# Patient Record
Sex: Male | Born: 1980 | Race: White | Hispanic: No | Marital: Married | Smoking: Never smoker
Health system: Southern US, Community
[De-identification: ages and names within clinical notes are randomized; demographics above are authoritative.]

## PROBLEM LIST (undated history)

## (undated) DIAGNOSIS — Z8619 Personal history of other infectious and parasitic diseases: Secondary | ICD-10-CM

## (undated) DIAGNOSIS — N200 Calculus of kidney: Secondary | ICD-10-CM

## (undated) HISTORY — DX: Personal history of other infectious and parasitic diseases: Z86.19

## (undated) HISTORY — PX: NO PAST SURGERIES: SHX2092

## (undated) HISTORY — DX: Calculus of kidney: N20.0

---

## 2015-06-15 ENCOUNTER — Emergency Department (HOSPITAL_COMMUNITY)
Admission: EM | Admit: 2015-06-15 | Discharge: 2015-06-15 | Disposition: A | Discharge status: Home / Self Care | Payer: Federal, State, Local not specified - PPO | Source: Home / Self Care | Attending: Family Medicine | Admitting: Family Medicine

## 2015-06-15 ENCOUNTER — Encounter (HOSPITAL_COMMUNITY): Payer: Self-pay | Admitting: Emergency Medicine

## 2015-06-15 DIAGNOSIS — R319 Hematuria, unspecified: Secondary | ICD-10-CM | POA: Diagnosis not present

## 2015-06-15 LAB — POCT URINALYSIS DIP (DEVICE)
BILIRUBIN URINE: NEGATIVE
Glucose, UA: NEGATIVE mg/dL
Ketones, ur: NEGATIVE mg/dL
LEUKOCYTES UA: NEGATIVE
NITRITE: NEGATIVE
Protein, ur: NEGATIVE mg/dL
Specific Gravity, Urine: 1.02 (ref 1.005–1.030)
Urobilinogen, UA: 0.2 mg/dL (ref 0.0–1.0)
pH: 6.5 (ref 5.0–8.0)

## 2015-06-15 NOTE — Discharge Instructions (Signed)

## 2015-06-15 NOTE — ED Provider Notes (Signed)
CSN: [id number]     Arrival date & time 06/15/15  1809 History   First MD Initiated Contact with Patient 06/15/15 1855     Chief Complaint  Patient presents with  . Flank Pain   (Consider location/radiation/quality/duration/timing/severity/associated sxs/prior Treatment) Patient is a 34 y.o. male presenting with flank pain. The history is provided by the patient. No language interpreter was used.  Flank Pain This is a new problem. The current episode started yesterday. The problem occurs constantly. The problem has been gradually worsening. Pertinent negatives include no abdominal pain. Nothing aggravates the symptoms. Nothing relieves the symptoms. He has tried nothing for the symptoms.  Pt had pain in his back earlier today and then noticed blood in his urine. Pt had a kidney stone 2 years ago.  Pain is gone now.   Past Medical History  Diagnosis Date  . Hypertension    History reviewed. No pertinent past surgical history. No family history on file. Social History  Substance Use Topics  . Smoking status: Never Smoker   . Smokeless tobacco: Current User    Types: Chew  . Alcohol Use: Yes     Comment: socially     Review of Systems  Gastrointestinal: Negative for abdominal pain.  Genitourinary: Positive for flank pain.  All other systems reviewed and are negative.   Allergies  Review of patient's allergies indicates no known allergies.  Home Medications   Prior to Admission medications   Not on File   Meds Ordered and Administered this Visit  Medications - No data to display  BP 128/81 mmHg  Pulse 80  Temp(Src) 98.4 F (36.9 C) (Oral)  Resp 16  SpO2 97% No data found.   Physical Exam  Constitutional: He appears well-developed and well-nourished.  HENT:  Head: Normocephalic.  Neck: Normal range of motion.  Cardiovascular: Normal rate.   Pulmonary/Chest: Effort normal.  Abdominal: Soft.  Musculoskeletal: Normal range of motion.  Neurological: He is alert.   Skin: Skin is warm.  Psychiatric: He has a normal mood and affect.  Nursing note and vitals reviewed.   ED Course  Procedures (including critical care time)  Labs Review Labs Reviewed  POCT URINALYSIS DIP (DEVICE) - Abnormal; Notable for the following:    Hgb urine dipstick LARGE (*)    All other components within normal limits    Imaging Review No results found.   Visual Acuity Review  Right Eye Distance:   Left Eye Distance:   Bilateral Distance:    Right Eye Near:   Left Eye Near:    Bilateral Near:         MDM Pt counseled on hematuria.  He may have passed a stone.   Pt advised if pain returns go to the Emergency department.    1. Hematuria    Follow up with urology for recheck.     [place], PA-C 06/15/15 1934

## 2015-06-15 NOTE — ED Notes (Signed)
Pt here with localized left flank intermit pain that eases with rest  sx's dull ache started yesterday with hematuria Hx Kidney stone 2 years ago Denies fever,chills,n,v afebrile

## 2015-06-17 ENCOUNTER — Emergency Department (HOSPITAL_COMMUNITY): Payer: Federal, State, Local not specified - PPO

## 2015-06-17 ENCOUNTER — Emergency Department (HOSPITAL_COMMUNITY)
Admission: EM | Admit: 2015-06-17 | Discharge: 2015-06-17 | Disposition: A | Discharge status: Home / Self Care | Payer: Federal, State, Local not specified - PPO | Attending: Emergency Medicine | Admitting: Emergency Medicine

## 2015-06-17 ENCOUNTER — Encounter (HOSPITAL_COMMUNITY): Payer: Self-pay | Admitting: Emergency Medicine

## 2015-06-17 DIAGNOSIS — R319 Hematuria, unspecified: Secondary | ICD-10-CM | POA: Diagnosis present

## 2015-06-17 DIAGNOSIS — N219 Calculus of lower urinary tract, unspecified: Secondary | ICD-10-CM | POA: Diagnosis not present

## 2015-06-17 DIAGNOSIS — I10 Essential (primary) hypertension: Secondary | ICD-10-CM | POA: Insufficient documentation

## 2015-06-17 DIAGNOSIS — R109 Unspecified abdominal pain: Secondary | ICD-10-CM

## 2015-06-17 DIAGNOSIS — R3129 Other microscopic hematuria: Secondary | ICD-10-CM

## 2015-06-17 LAB — BASIC METABOLIC PANEL
Anion gap: 7 (ref 5–15)
BUN: 13 mg/dL (ref 6–20)
CHLORIDE: 105 mmol/L (ref 101–111)
CO2: 27 mmol/L (ref 22–32)
CREATININE: 1.07 mg/dL (ref 0.61–1.24)
Calcium: 9.6 mg/dL (ref 8.9–10.3)
GFR calc Af Amer: >60 mL/min (ref >60)
GFR calc non Af Amer: >60 mL/min (ref >60)
Glucose, Bld: 83 mg/dL (ref 65–99)
Potassium: 4 mmol/L (ref 3.5–5.1)
SODIUM: 139 mmol/L (ref 135–145)

## 2015-06-17 LAB — LIPASE, BLOOD: Lipase: 32 U/L (ref 11–51)

## 2015-06-17 LAB — CBC
HCT: 44.5 % (ref 39.0–52.0)
Hemoglobin: 15.5 g/dL (ref 13.0–17.0)
MCH: 30.3 pg (ref 26.0–34.0)
MCHC: 34.8 g/dL (ref 30.0–36.0)
MCV: 87.1 fL (ref 78.0–100.0)
PLATELETS: 252 10*3/uL (ref 150–400)
RBC: 5.11 MIL/uL (ref 4.22–5.81)
RDW: 12.6 % (ref 11.5–15.5)
WBC: 8.8 10*3/uL (ref 4.0–10.5)

## 2015-06-17 LAB — URINALYSIS, ROUTINE W REFLEX MICROSCOPIC
BILIRUBIN URINE: NEGATIVE
GLUCOSE, UA: NEGATIVE mg/dL
Ketones, ur: NEGATIVE mg/dL
Leukocytes, UA: NEGATIVE
NITRITE: NEGATIVE
PH: 6.5 (ref 5.0–8.0)
Protein, ur: NEGATIVE mg/dL
Specific Gravity, Urine: 1.014 (ref 1.005–1.030)

## 2015-06-17 LAB — URINE MICROSCOPIC-ADD ON
BACTERIA UA: NONE SEEN
Squamous Epithelial / LPF: NONE SEEN

## 2015-06-17 NOTE — ED Notes (Signed)
Pt. Stated, Im having blood in my urine since Wed. Went to UC and told if pain comes back to come here.

## 2015-06-17 NOTE — ED Notes (Signed)
Ed res  At the bedside  Seeing pt at present

## 2015-06-17 NOTE — ED Provider Notes (Signed)
CSN: [id number]     Arrival date & time 06/17/15  1324 History   First MD Initiated Contact with Patient 06/17/15 1538     Chief Complaint  Patient presents with  . Hematuria  . Flank Pain    Patient is a 34 y.o. male presenting with hematuria and flank pain. The history is provided by the patient and medical records.  Hematuria This is a new problem. The current episode started in the past 7 days. The problem occurs constantly. The problem has been unchanged. Pertinent negatives include no abdominal pain, chest pain, chills, coughing, fever, headaches, nausea, neck pain, rash, sore throat or vomiting. Nothing aggravates the symptoms. He has tried NSAIDs for the symptoms.  Flank Pain Pertinent negatives include no abdominal pain, chest pain, chills, coughing, fever, headaches, nausea, neck pain, rash, sore throat or vomiting.   Past Medical History  Diagnosis Date  . Hypertension    History reviewed. No pertinent past surgical history. No family history on file. Social History  Substance Use Topics  . Smoking status: Never Smoker   . Smokeless tobacco: Current User    Types: Chew  . Alcohol Use: Yes     Comment: socially     Review of Systems  Constitutional: Negative for fever and chills.  HENT: Negative for rhinorrhea and sore throat.   Eyes: Negative for visual disturbance.  Respiratory: Negative for cough and shortness of breath.   Cardiovascular: Negative for chest pain.  Gastrointestinal: Negative for nausea, vomiting, abdominal pain, diarrhea and constipation.  Genitourinary: Positive for hematuria and flank pain. Negative for dysuria.  Musculoskeletal: Negative for back pain and neck pain.  Skin: Negative for rash.  Neurological: Negative for syncope and headaches.  Psychiatric/Behavioral: Negative for confusion.  All other systems reviewed and are negative.  Allergies  Review of patient's allergies indicates no known allergies.  Home Medications   Prior to  Admission medications   Not on File   BP 132/87 mmHg  Pulse 79  Temp(Src) 98.2 F (36.8 C) (Oral)  Resp 20  Wt 106.595 kg  SpO2 100% Physical Exam  Constitutional: He is oriented to person, place, and time. He appears well-developed and well-nourished. No distress.  HENT:  Head: Normocephalic and atraumatic.  Mouth/Throat: Oropharynx is clear and moist.  Eyes: EOM are normal.  Neck: Neck supple. No JVD present.  Cardiovascular: Normal rate, regular rhythm, normal heart sounds and intact distal pulses.   Pulmonary/Chest: Effort normal and breath sounds normal.  Abdominal: Soft. He exhibits no distension. There is no tenderness. There is no rigidity, no rebound, no guarding and no CVA tenderness.  Musculoskeletal: Normal range of motion. He exhibits no edema.  Neurological: He is alert and oriented to person, place, and time. No cranial nerve deficit.  Skin: Skin is warm and dry.  Psychiatric: His behavior is normal.    ED Course  Procedures  EMERGENCY DEPARTMENT US RENAL EXAM  "Study: Limited Retroperitoneal Ultrasound of Kidneys"  INDICATIONS: Flank pain and Hematuria  Long and short axis of both kidneys were obtained.   PERFORMED BY: Myself  IMAGES ARCHIVED?: Yes  LIMITATIONS: None  VIEWS USED: Long axis and Short axis   INTERPRETATION: No Hydronephrosis, No Kidney stone   CPT Code: [id number] (limited retroperitoneal)   Labs Review Labs Reviewed  URINALYSIS, ROUTINE W REFLEX MICROSCOPIC (NOT AT [facility]) - Abnormal; Notable for the following:    APPearance CLOUDY (*)    Hgb urine dipstick LARGE (*)    All other components within  normal limits  BASIC METABOLIC PANEL  CBC  LIPASE, BLOOD  URINE MICROSCOPIC-ADD ON    MDM   Final diagnoses:  Left flank pain  Microscopic hematuria    34 yo M with a PMH of kidney stones who presents with left flank pain and hematuria x 3 days. Seen at [facility] yesterday for same. Large blood in urine. Currently pain free but states  pain was 8/10 around 10am today. No vomiting, nausea or fevers, does report chills and some diarrhea today. Abdomen soft without peritonitis. No CVAT. Clinically looks well.  No imaging to see evidence of stone or hydronephrosis. Kidney function normal today. H/o stones. AF, VSS. Doubt pyelo. Bedside renal US negative for stone or hydronephrosis.   Will get CT to confirm stone. 5 x 3 stone in bladder. No acute intervention at this time. Recommended NSAIDs. Pt states he has f/u with Urology in 5 days. I encouraged him to keep his appt. He is agreeable with plan.  Discussed with Dr. Lita Mains.     Gustavus Bryant, MD 06/18/15 0020  Julianne Rice, MD 06/19/15 2329

## 2015-06-17 NOTE — Discharge Instructions (Signed)
Flank Pain °Flank pain refers to pain that is located on the side of the body between the upper abdomen and the back. The pain may occur over a short period of time (acute) or may be long-term or reoccurring (chronic). It may be mild or severe. Flank pain can be caused by many things. °CAUSES  °Some of the more common causes of flank pain include: °· Muscle strains.   °· Muscle spasms.   °· A disease of your spine (vertebral disk disease).   °· A lung infection (pneumonia).   °· Fluid around your lungs (pulmonary edema).   °· A kidney infection.   °· Kidney stones.   °· A very painful skin rash caused by the chickenpox virus (shingles).   °· Gallbladder disease.   °HOME CARE INSTRUCTIONS  °Home care will depend on the cause of your pain. In general, °· Rest as directed by your caregiver. °· Drink enough fluids to keep your urine clear or pale yellow. °· Only take over-the-counter or prescription medicines as directed by your caregiver. Some medicines may help relieve the pain. °· Tell your caregiver about any changes in your pain. °· Follow up with your caregiver as directed. °SEEK IMMEDIATE MEDICAL CARE IF:  °· Your pain is not controlled with medicine.   °· You have new or worsening symptoms. °· Your pain increases.   °· You have abdominal pain.   °· You have shortness of breath.   °· You have persistent nausea or vomiting.   °· You have swelling in your abdomen.   °· You feel faint or pass out.   °· You have blood in your urine. °· You have a fever or persistent symptoms for more than 2-3 days. °· You have a fever and your symptoms suddenly get worse. °MAKE SURE YOU:  °· Understand these instructions. °· Will watch your condition. °· Will get help right away if you are not doing well or get worse. °  °This information is not intended to replace advice given to you by your health care provider. Make sure you discuss any questions you have with your health care provider. °  °Document Released: 08/22/2005 Document  Revised: 03/25/2012 Document Reviewed: 02/13/2012 °Elsevier Interactive Patient Education ©2016 Elsevier Inc. ° °

## 2015-06-17 NOTE — ED Notes (Signed)
Lt flank pain for approx one week   He was sen at [facility] Thursday.  Pain has continued and he had pain earlier none now

## 2016-06-20 IMAGING — CT CT RENAL STONE PROTOCOL
2 of 4 series · 16 of 46 positions shown, 18 images · non-contrast
Comparison: None.

CLINICAL DATA: Left flank pain. Hematuria. History of
nephrolithiasis.

EXAM:
CT ABDOMEN AND PELVIS WITHOUT CONTRAST
TECHNIQUE: Multidetector CT imaging of the abdomen and pelvis was performed
following the standard protocol without IV contrast.

[Series 3: stone study 5.0 i30f 1 · axial · 0.73mm/px · z∈[+716,+1176]mm · 13 of 102 slices shown, 15 images]
[im 5/102  soft-tissue]
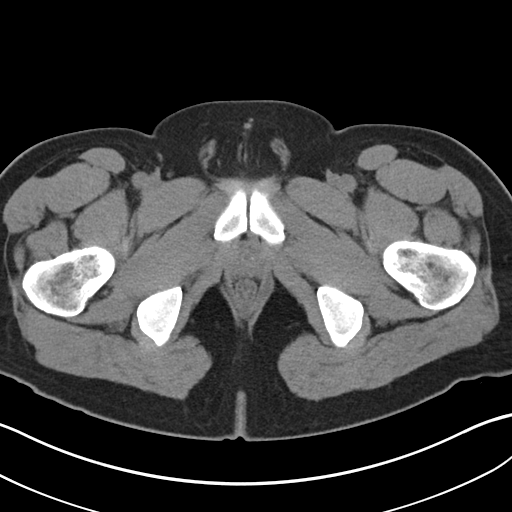
[im 5/102  bone]
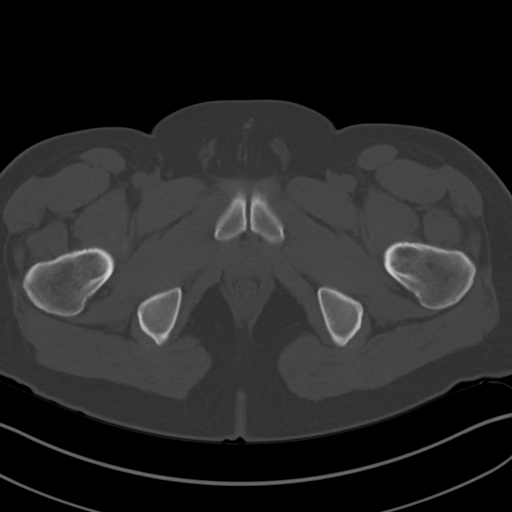
[im 13/102  soft-tissue]
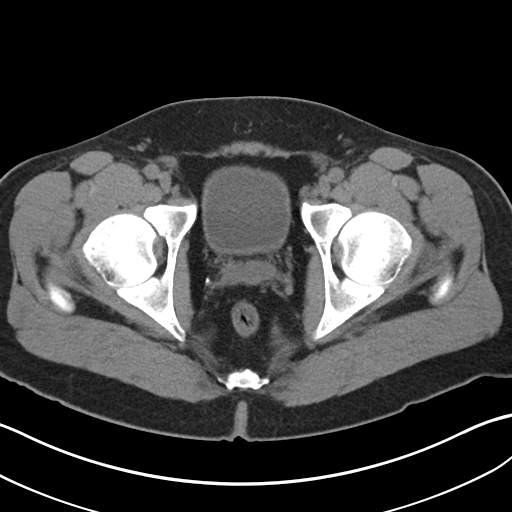
[im 22/102  soft-tissue]
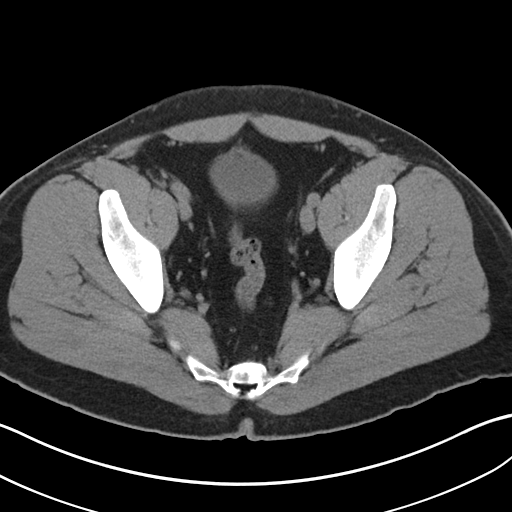
[im 30/102  soft-tissue]
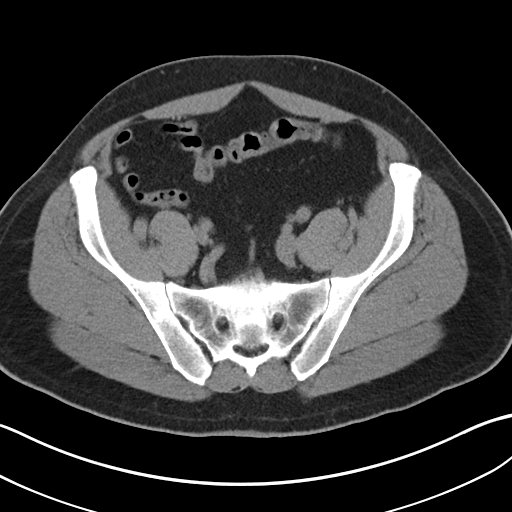
[im 34/102  soft-tissue]
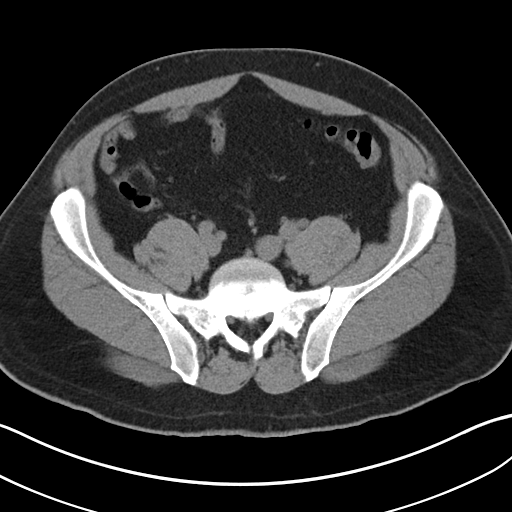
[im 43/102  soft-tissue]
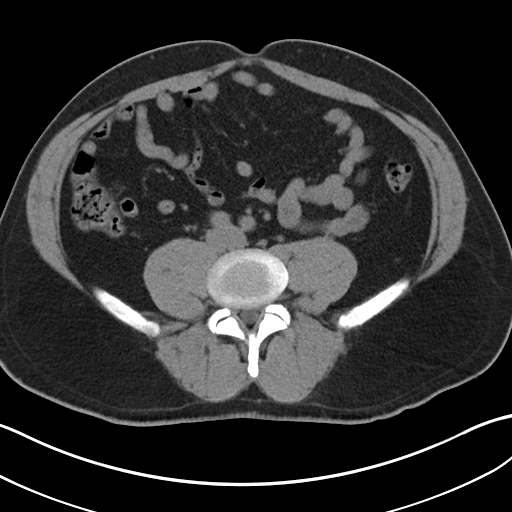
[im 51/102  soft-tissue]
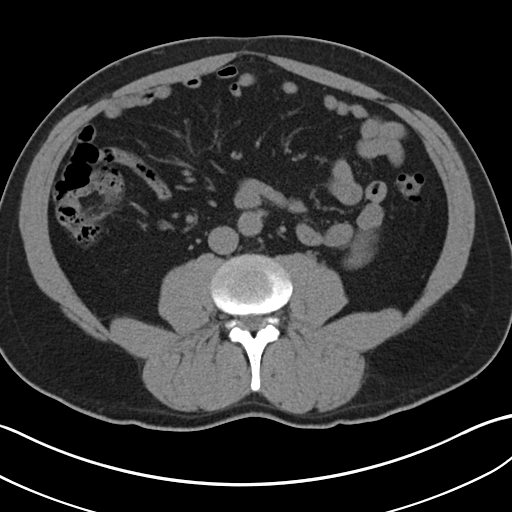
[im 59/102  soft-tissue]
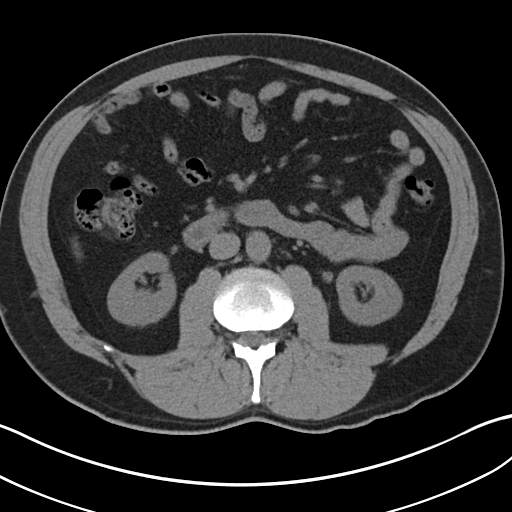
[im 68/102  soft-tissue]
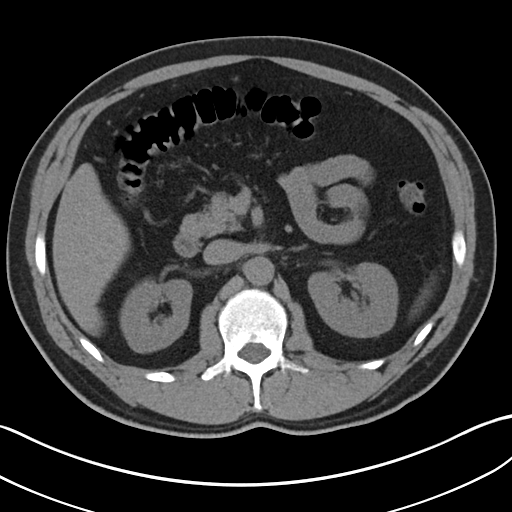
[im 68/102  bone]
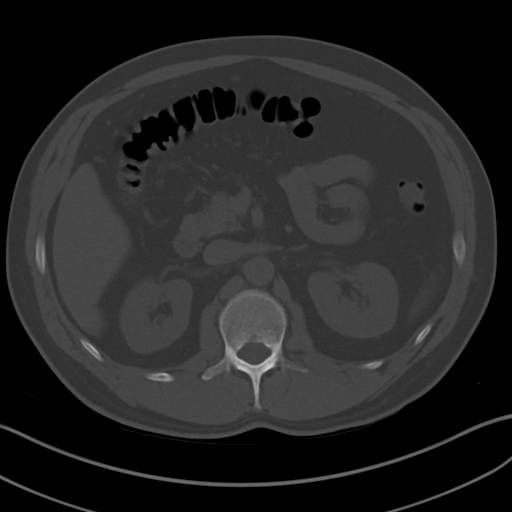
[im 72/102  soft-tissue]
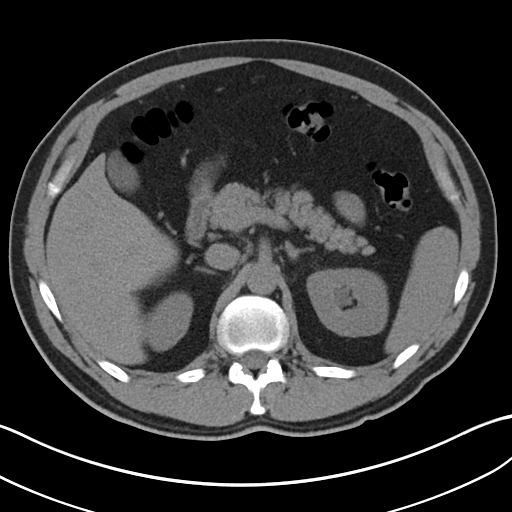
[im 80/102  soft-tissue]
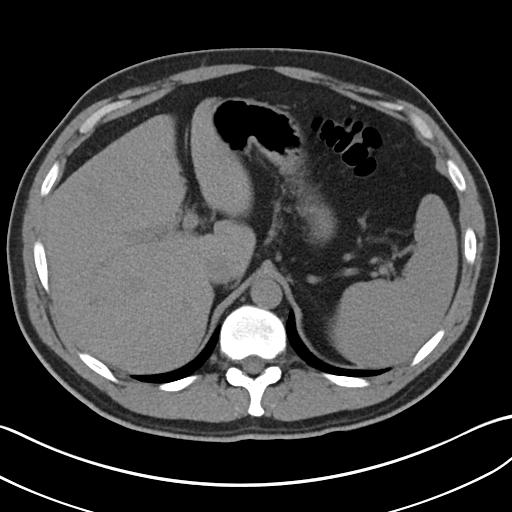
[im 89/102  soft-tissue]
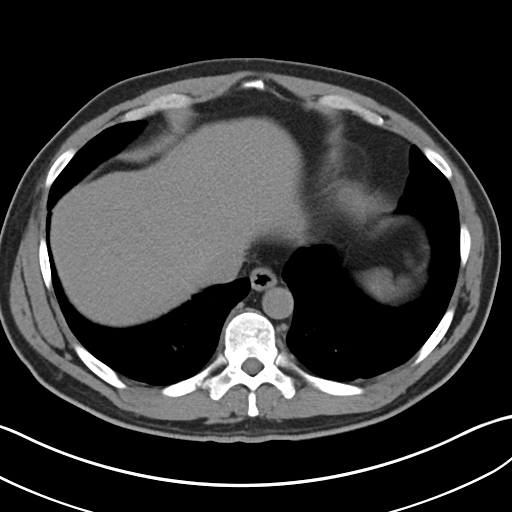
[im 97/102  soft-tissue]
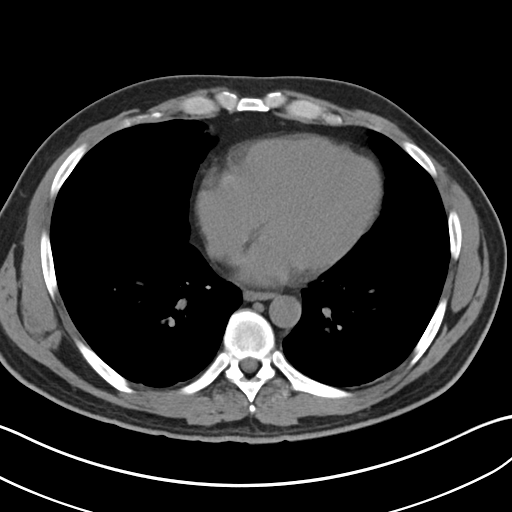

[Series 6: coronal soft tissue · coronal · 0.83mm/px · 3 of 104 slices shown]
[im 35/104  soft-tissue]
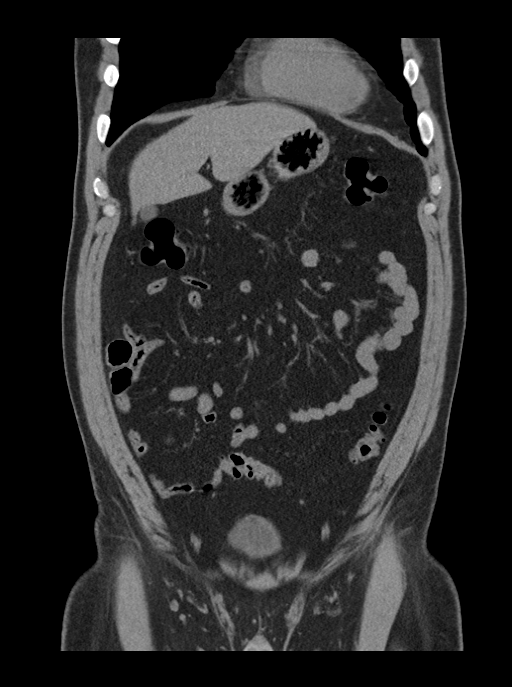
[im 46/104  soft-tissue]
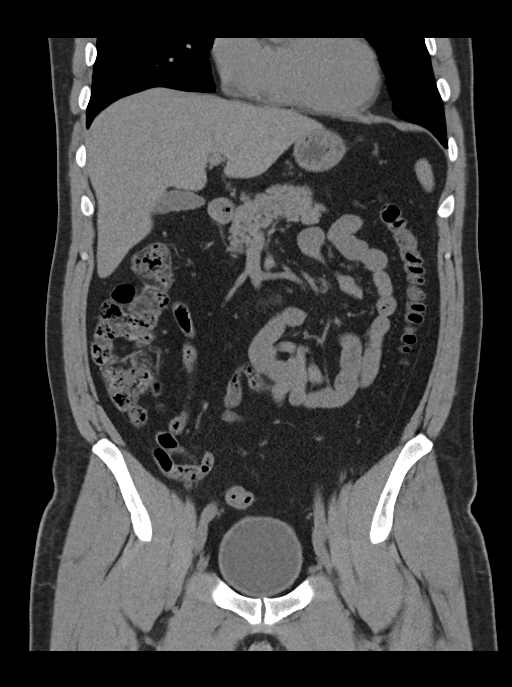
[im 58/104  soft-tissue]
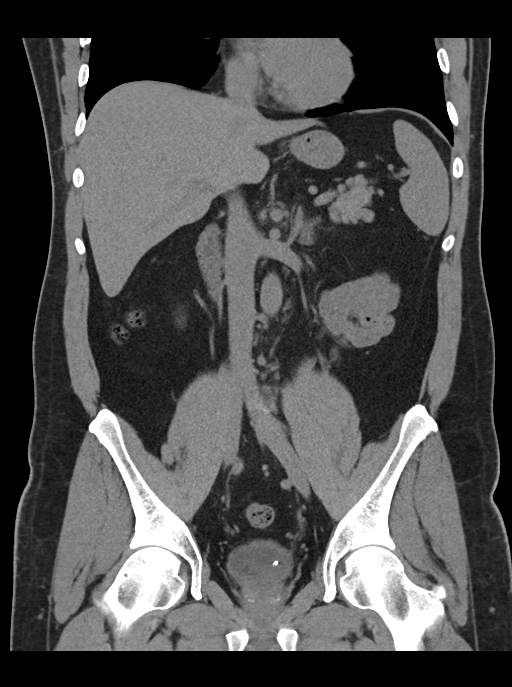

[16 of 46 positions shown; findings below may reference images not displayed]

FINDINGS: Lower chest: No significant pulmonary nodules or acute consolidative
airspace disease.

Hepatobiliary: Normal liver with no liver mass. Normal gallbladder
with no radiopaque cholelithiasis. No biliary ductal dilatation.

Pancreas: Normal, with no mass or duct dilation.

Spleen: Normal size. No mass.

Adrenals/Urinary Tract: Normal adrenals. There is a 5 x 3 mm stone
in the left posterior bladder lumen with an or adjacent to the left
ureterovesical junction. There is minimal left
hydroureteronephrosis. No additional renal or ureteral stones. No
right hydronephrosis. No contour deforming renal mass. No
perinephric fat stranding. Normal bladder.

Stomach/Bowel: Grossly normal stomach. Normal caliber small bowel
with no small bowel wall thickening. Normal appendix. Normal large
bowel with no diverticulosis, large bowel wall thickening or
pericolonic fat stranding.

Vascular/Lymphatic: Normal caliber abdominal aorta. No
pathologically enlarged lymph nodes in the abdomen or pelvis.

Reproductive: Normal size prostate with coarse nonspecific internal
prostatic calcifications.

Other: No pneumoperitoneum, ascites or focal fluid collection.

Musculoskeletal: No aggressive appearing focal osseous lesions.
IMPRESSION: 1. Left posterior bladder lumen 5 x 3 mm stone within or adjacent to
the left ureterovesical junction, with minimal left
hydroureteronephrosis.
2. No additional urolithiasis.

## 2016-11-27 ENCOUNTER — Encounter: Payer: Self-pay | Admitting: Physician Assistant

## 2016-11-27 ENCOUNTER — Ambulatory Visit (INDEPENDENT_AMBULATORY_CARE_PROVIDER_SITE_OTHER): Payer: Federal, State, Local not specified - PPO | Admitting: Physician Assistant

## 2016-11-27 VITALS — BP 128/94 | HR 76 | Temp 98.3°F | Resp 14 | Ht 74.5 in | Wt 234.0 lb

## 2016-11-27 DIAGNOSIS — R030 Elevated blood-pressure reading, without diagnosis of hypertension: Secondary | ICD-10-CM

## 2016-11-27 DIAGNOSIS — Z0000 Encounter for general adult medical examination without abnormal findings: Secondary | ICD-10-CM

## 2016-11-27 LAB — URINALYSIS, ROUTINE W REFLEX MICROSCOPIC
BILIRUBIN URINE: NEGATIVE
HGB URINE DIPSTICK: NEGATIVE
Ketones, ur: NEGATIVE
LEUKOCYTES UA: NEGATIVE
Nitrite: NEGATIVE
PH: 7 (ref 5.0–8.0)
RBC / HPF: NONE SEEN (ref 0–?)
Specific Gravity, Urine: 1.02 (ref 1.000–1.030)
TOTAL PROTEIN, URINE-UPE24: NEGATIVE
Urine Glucose: NEGATIVE
Urobilinogen, UA: 0.2 (ref 0.0–1.0)

## 2016-11-27 LAB — CBC
HEMATOCRIT: 49.6 % (ref 39.0–52.0)
Hemoglobin: 16.7 g/dL (ref 13.0–17.0)
MCHC: 33.7 g/dL (ref 30.0–36.0)
MCV: 88.5 fl (ref 78.0–100.0)
Platelets: 250 10*3/uL (ref 150.0–400.0)
RBC: 5.6 Mil/uL (ref 4.22–5.81)
RDW: 13 % (ref 11.5–15.5)
WBC: 7.8 10*3/uL (ref 4.0–10.5)

## 2016-11-27 LAB — LIPID PANEL
Cholesterol: 243 mg/dL — ABNORMAL HIGH (ref 0–200)
HDL: 43.4 mg/dL (ref >39.00)
LDL Cholesterol: 170 mg/dL — ABNORMAL HIGH (ref 0–99)
NONHDL: 199.25
Total CHOL/HDL Ratio: 6
Triglycerides: 148 mg/dL (ref 0.0–149.0)
VLDL: 29.6 mg/dL (ref 0.0–40.0)

## 2016-11-27 LAB — COMPREHENSIVE METABOLIC PANEL
ALBUMIN: 4.8 g/dL (ref 3.5–5.2)
ALT: 23 U/L (ref 0–53)
AST: 15 U/L (ref 0–37)
Alkaline Phosphatase: 56 U/L (ref 39–117)
BILIRUBIN TOTAL: 1.3 mg/dL — AB (ref 0.2–1.2)
BUN: 16 mg/dL (ref 6–23)
CO2: 29 mEq/L (ref 19–32)
Calcium: 9.9 mg/dL (ref 8.4–10.5)
Chloride: 104 mEq/L (ref 96–112)
Creatinine, Ser: 0.93 mg/dL (ref 0.40–1.50)
GFR: 96.95 mL/min (ref >60.00)
Glucose, Bld: 83 mg/dL (ref 70–99)
POTASSIUM: 4.2 meq/L (ref 3.5–5.1)
SODIUM: 140 meq/L (ref 135–145)
TOTAL PROTEIN: 7.3 g/dL (ref 6.0–8.3)

## 2016-11-27 LAB — HEMOGLOBIN A1C: HEMOGLOBIN A1C: 5.2 % (ref 4.6–6.5)

## 2016-11-27 LAB — TSH: TSH: 0.73 u[IU]/mL (ref 0.35–4.50)

## 2016-11-27 NOTE — Assessment & Plan Note (Signed)
Mild elevation in DBP. Same on repeat. DASH diet reviewed. Patient to start home BP checks a few times per week and if he has any headache. Follow-up 6 weeks to review BP journal and reassess in office.

## 2016-11-27 NOTE — Progress Notes (Signed)
Patient presents to clinic today to establish care.  Diet -- Eats poorly per patient -- frozen meats and fast food. Is recently working on diet -- trying to cut out these things. Has increased grilled vegetables.  Exercise -- No current exercise.   Acute Concerns: Denies acute concerns at today's visit.  Health Maintenance: Immunizations --  07/15/2013  Past Medical History:  Diagnosis Date  . History of chickenpox   . Kidney stones     Past Surgical History:  Procedure Laterality Date  . NO PAST SURGERIES      No current outpatient prescriptions on file prior to visit.   No current facility-administered medications on file prior to visit.     No Known Allergies  Family History  Problem Relation Age of Onset  . Cancer Mother        Leiomyosarcoma  . Diabetes Father   . Multiple sclerosis Sister   . Healthy Son        62 years old  . Cancer Maternal Uncle        Lung  . Cancer Paternal Grandmother        Breast  . Heart disease Paternal Grandfather   . Healthy Sister   . Healthy Sister     Social History   Social History  . Marital status: Married    Spouse name: Joycelyn Schmid  . Number of children: 1  . Years of education: 7   Occupational History  . Mechanic    Social History Main Topics  . Smoking status: Never Smoker  . Smokeless tobacco: Current User    Types: Chew  . Alcohol use Yes     Comment: 6-10 beers per week  . Drug use: No  . Sexual activity: Yes    Birth control/ protection: IUD     Comment: Mirena   Other Topics Concern  . Not on file   Social History Narrative  . No narrative on file   Review of Systems  Constitutional: Negative for fever and weight loss.  HENT: Negative for ear discharge, ear pain, hearing loss and tinnitus.   Eyes: Negative for blurred vision, double vision, photophobia and pain.  Respiratory: Negative for cough and shortness of breath.   Cardiovascular: Negative for chest pain and palpitations.    Gastrointestinal: Negative for abdominal pain, blood in stool, constipation, diarrhea, heartburn, melena, nausea and vomiting.  Genitourinary: Negative for dysuria, flank pain, frequency, hematuria and urgency.  Musculoskeletal: Negative for falls.  Neurological: Negative for dizziness, loss of consciousness and headaches.  Endo/Heme/Allergies: Negative for environmental allergies.  Psychiatric/Behavioral: Negative for depression, hallucinations, substance abuse and suicidal ideas. The patient is not nervous/anxious and does not have insomnia.    BP (!) 128/94 (BP Location: Right Arm, Cuff Size: Normal)   Pulse 76   Temp 98.3 F (36.8 C) (Oral)   Resp 14   Ht 6' 2.5" (1.892 m)   Wt 234 lb (106.1 kg)   SpO2 98%   BMI 29.64 kg/m   Physical Exam  Constitutional: He is oriented to person, place, and time and well-developed, well-nourished, and in no distress.  HENT:  Head: Normocephalic and atraumatic.  Right Ear: External ear normal.  Left Ear: External ear normal.  Nose: Nose normal.  Mouth/Throat: Oropharynx is clear and moist. No oropharyngeal exudate.  Eyes: Conjunctivae and EOM are normal. Pupils are equal, round, and reactive to light.  Neck: Neck supple. No thyromegaly present.  Cardiovascular: Normal rate, regular rhythm, normal heart sounds  and intact distal pulses.   Pulmonary/Chest: Effort normal and breath sounds normal. No respiratory distress. He has no wheezes. He has no rales. He exhibits no tenderness.  Abdominal: Soft. Bowel sounds are normal. He exhibits no distension and no mass. There is no tenderness. There is no rebound and no guarding.  Genitourinary: Testes/scrotum normal.  Lymphadenopathy:    He has no cervical adenopathy.  Neurological: He is alert and oriented to person, place, and time.  Skin: Skin is warm and dry. No rash noted.  Psychiatric: Affect normal.  Vitals reviewed.  Assessment/Plan: Visit for preventive health examination Depression  screen negative. Health Maintenance reviewed -- Tetanus up-to-date. Preventive schedule discussed and handout given in AVS. Will obtain fasting labs today.   Elevated BP without diagnosis of hypertension Mild elevation in DBP. Same on repeat. DASH diet reviewed. Patient to start home BP checks a few times per week and if he has any headache. Follow-up 6 weeks to review BP journal and reassess in office.     Leeanne Rio, PA-C

## 2016-11-27 NOTE — Patient Instructions (Signed)
Please go to the lab for blood work.   Our office will call you with your results unless you have chosen to receive results via MyChart.  If your blood work is normal we will follow-up each year for physicals and as scheduled for chronic medical problems.  If anything is abnormal we will treat accordingly and get you in for a follow-up.  Please start the dietary recommendations below. Work on increasing aerobic exercise to an eventual goal of 150 minutes per week.  Get a home BP cuff and check BP a couple of times per week. Write these numbers down. Also check any time you have a headache. Follow-up in 6 weeks and bring this journal to that appointment.     Preventive Care 18-39 Years, Male Preventive care refers to lifestyle choices and visits with your health care provider that can promote health and wellness. What does preventive care include?  A yearly physical exam. This is also called an annual well check.  Dental exams once or twice a year.  Routine eye exams. Ask your health care provider how often you should have your eyes checked.  Personal lifestyle choices, including:  Daily care of your teeth and gums.  Regular physical activity.  Eating a healthy diet.  Avoiding tobacco and drug use.  Limiting alcohol use.  Practicing safe sex. What happens during an annual well check? The services and screenings done by your health care provider during your annual well check will depend on your age, overall health, lifestyle risk factors, and family history of disease. Counseling  Your health care provider may ask you questions about your:  Alcohol use.  Tobacco use.  Drug use.  Emotional well-being.  Home and relationship well-being.  Sexual activity.  Eating habits.  Work and work Statistician. Screening  You may have the following tests or measurements:  Height, weight, and BMI.  Blood pressure.  Lipid and cholesterol levels. These may be checked every  5 years starting at age 97.  Diabetes screening. This is done by checking your blood sugar (glucose) after you have not eaten for a while (fasting).  Skin check.  Hepatitis C blood test.  Hepatitis B blood test.  Sexually transmitted disease (STD) testing. Discuss your test results, treatment options, and if necessary, the need for more tests with your health care provider. Vaccines  Your health care provider may recommend certain vaccines, such as:  Influenza vaccine. This is recommended every year.  Tetanus, diphtheria, and acellular pertussis (Tdap, Td) vaccine. You may need a Td booster every 10 years.  Varicella vaccine. You may need this if you have not been vaccinated.  HPV vaccine. If you are 78 or younger, you may need three doses over 6 months.  Measles, mumps, and rubella (MMR) vaccine. You may need at least one dose of MMR.You may also need a second dose.  Pneumococcal 13-valent conjugate (PCV13) vaccine. You may need this if you have certain conditions and have not been vaccinated.  Pneumococcal polysaccharide (PPSV23) vaccine. You may need one or two doses if you smoke cigarettes or if you have certain conditions.  Meningococcal vaccine. One dose is recommended if you are age 29-21 years and a first-year college student living in a residence hall, or if you have one of several medical conditions. You may also need additional booster doses.  Hepatitis A vaccine. You may need this if you have certain conditions or if you travel or work in places where you may be exposed to hepatitis  A.  Hepatitis B vaccine. You may need this if you have certain conditions or if you travel or work in places where you may be exposed to hepatitis B.  Haemophilus influenzae type b (Hib) vaccine. You may need this if you have certain risk factors. Talk to your health care provider about which screenings and vaccines you need and how often you need them. This information is not intended to  replace advice given to you by your health care provider. Make sure you discuss any questions you have with your health care provider. Document Released: 08/27/2001 Document Revised: 03/20/2016 Document Reviewed: 05/02/2015 Elsevier Interactive Patient Education  2017 [employer].

## 2016-11-27 NOTE — Progress Notes (Signed)
Pre visit review using our clinic review tool, if applicable. No additional management support is needed unless otherwise documented below in the visit note. 

## 2016-11-27 NOTE — Assessment & Plan Note (Signed)
Depression screen negative. Health Maintenance reviewed -- Tetanus up-to-date. Preventive schedule discussed and handout given in AVS. Will obtain fasting labs today.

## 2017-01-08 ENCOUNTER — Ambulatory Visit (INDEPENDENT_AMBULATORY_CARE_PROVIDER_SITE_OTHER): Payer: Federal, State, Local not specified - PPO | Admitting: Physician Assistant

## 2017-01-08 ENCOUNTER — Encounter: Payer: Self-pay | Admitting: Physician Assistant

## 2017-01-08 DIAGNOSIS — Z720 Tobacco use: Secondary | ICD-10-CM | POA: Insufficient documentation

## 2017-01-08 DIAGNOSIS — R030 Elevated blood-pressure reading, without diagnosis of hypertension: Secondary | ICD-10-CM | POA: Diagnosis not present

## 2017-01-08 MED ORDER — NICOTINE POLACRILEX 4 MG MT GUM: 4.0000 mg | CHEWING_GUM | OROMUCOSAL | 0 refills | Status: DC | PRN

## 2017-01-08 NOTE — Progress Notes (Signed)
Patient presents to clinic today for follow-up of elevated BP. At last visit patient was instructed to start DASH diet and other lifestyle interventions. Patient endorses since last visit he is following the DASH diet and a < 2500 calories/day diet. Has cut back on sodas and beers. Eating more lean protein and vegetables. Is going walking/jogging during lunch break daily. Has noted a 8 pound weight loss since last visit.   Past Medical History:  Diagnosis Date  . History of chickenpox   . Kidney stones     No current outpatient prescriptions on file prior to visit.   No current facility-administered medications on file prior to visit.     No Known Allergies  Family History  Problem Relation Age of Onset  . Cancer Mother        Leiomyosarcoma  . Diabetes Father   . Multiple sclerosis Sister   . Healthy Son        25 years old  . Cancer Maternal Uncle        Lung  . Cancer Paternal Grandmother        Breast  . Heart disease Paternal Grandfather   . Healthy Sister   . Healthy Sister     Social History   Social History  . Marital status: Married    Spouse name: Joycelyn Schmid  . Number of children: 1  . Years of education: 41   Occupational History  . Mechanic    Social History Main Topics  . Smoking status: Never Smoker  . Smokeless tobacco: Current User    Types: Chew  . Alcohol use Yes     Comment: 6-10 beers per week  . Drug use: No  . Sexual activity: Yes    Birth control/ protection: IUD     Comment: Mirena   Other Topics Concern  . None   Social History Narrative  . None   Review of Systems - See HPI.  All other ROS are negative.  BP 112/80   Pulse 87   Temp 98.2 F (36.8 C) (Oral)   Resp 14   Ht 6' 2.5" (1.892 m)   Wt 226 lb (102.5 kg)   SpO2 98%   BMI 28.63 kg/m   Physical Exam  Constitutional: He is oriented to person, place, and time and well-developed, well-nourished, and in no distress.  HENT:  Head: Normocephalic and atraumatic.    Eyes: Conjunctivae are normal.  Neck: Neck supple.  Cardiovascular: Normal rate, regular rhythm, normal heart sounds and intact distal pulses.   Pulmonary/Chest: Effort normal and breath sounds normal. No respiratory distress. He has no wheezes. He has no rales. He exhibits no tenderness.  Neurological: He is alert and oriented to person, place, and time.  Skin: Skin is warm and dry. No rash noted.  Psychiatric: Affect normal.  Vitals reviewed.   Recent Results (from the past 2160 hour(s))  CBC     Status: None   Collection Time: 11/27/16 11:15 AM  Result Value Ref Range   WBC 7.8 4.0 - 10.5 K/uL   RBC 5.60 4.22 - 5.81 Mil/uL   Platelets 250.0 150.0 - 400.0 K/uL   Hemoglobin 16.7 13.0 - 17.0 g/dL   HCT 49.6 39.0 - 52.0 %   MCV 88.5 78.0 - 100.0 fl   MCHC 33.7 30.0 - 36.0 g/dL   RDW 13.0 11.5 - 15.5 %  Comprehensive metabolic panel     Status: Abnormal   Collection Time: 11/27/16 11:15 AM  Result Value  Ref Range   Sodium 140 135 - 145 mEq/L   Potassium 4.2 3.5 - 5.1 mEq/L   Chloride 104 96 - 112 mEq/L   CO2 29 19 - 32 mEq/L   Glucose, Bld 83 70 - 99 mg/dL   BUN 16 6 - 23 mg/dL   Creatinine, Ser 0.93 0.40 - 1.50 mg/dL   Total Bilirubin 1.3 (H) 0.2 - 1.2 mg/dL   Alkaline Phosphatase 56 39 - 117 U/L   AST 15 0 - 37 U/L   ALT 23 0 - 53 U/L   Total Protein 7.3 6.0 - 8.3 g/dL   Albumin 4.8 3.5 - 5.2 g/dL   Calcium 9.9 8.4 - 10.5 mg/dL   GFR 96.95 >60.00 mL/min  Hemoglobin A1c     Status: None   Collection Time: 11/27/16 11:15 AM  Result Value Ref Range   Hgb A1c MFr Bld 5.2 4.6 - 6.5 %    Comment: Glycemic Control Guidelines for People with Diabetes:Non Diabetic:  <6%Goal of Therapy: <7%Additional Action Suggested:  >8%   Lipid panel     Status: Abnormal   Collection Time: 11/27/16 11:15 AM  Result Value Ref Range   Cholesterol 243 (H) 0 - 200 mg/dL    Comment: ATP III Classification       Desirable:  < 200 mg/dL               Borderline High:  200 - 239 mg/dL           High:  > = 240 mg/dL   Triglycerides 148.0 0.0 - 149.0 mg/dL    Comment: Normal:  <150 mg/dLBorderline High:  150 - 199 mg/dL   HDL 43.40 >39.00 mg/dL   VLDL 29.6 0.0 - 40.0 mg/dL   LDL Cholesterol 170 (H) 0 - 99 mg/dL   Total CHOL/HDL Ratio 6     Comment:                Men          Women1/2 Average Risk     3.4          3.3Average Risk          5.0          4.42X Average Risk          9.6          7.13X Average Risk          15.0          11.0                       NonHDL 199.25     Comment: NOTE:  Non-HDL goal should be 30 mg/dL higher than patient's LDL goal (i.e. LDL goal of < 70 mg/dL, would have non-HDL goal of < 100 mg/dL)  TSH     Status: None   Collection Time: 11/27/16 11:15 AM  Result Value Ref Range   TSH 0.73 0.35 - 4.50 uIU/mL  Urinalysis, Routine w reflex microscopic     Status: Abnormal   Collection Time: 11/27/16 11:15 AM  Result Value Ref Range   Color, Urine YELLOW Yellow;Lt. Yellow   APPearance Sl Cloudy (A) Clear   Specific Gravity, Urine 1.020 1.000 - 1.030   pH 7.0 5.0 - 8.0   Total Protein, Urine NEGATIVE Negative   Urine Glucose NEGATIVE Negative   Ketones, ur NEGATIVE Negative   Bilirubin Urine NEGATIVE Negative   Hgb urine dipstick NEGATIVE  Negative   Urobilinogen, UA 0.2 0.0 - 1.0   Leukocytes, UA NEGATIVE Negative   Nitrite NEGATIVE Negative   WBC, UA 0-2/hpf 0-2/hpf   RBC / HPF none seen 0-2/hpf   Squamous Epithelial / LPF Rare(0-4/hpf) Rare(0-4/hpf)    Assessment/Plan: Elevated BP without diagnosis of hypertension BP much improved with DASH diet and TLC. BP normotensive. Continue lifestyle changes. Reassess in 3 months.   Chewing tobacco use Discussed cessation with patient. He endorses readiness. Has had significant issue with cold [place] cessation previously. Has not tried anything over the counter. Will start with Nicorette gum at a limited dose. Follow-up 1 month.    Leeanne Rio, PA-C

## 2017-01-08 NOTE — Assessment & Plan Note (Signed)
BP much improved with DASH diet and TLC. BP normotensive. Continue lifestyle changes. Reassess in 3 months.

## 2017-01-08 NOTE — Assessment & Plan Note (Signed)
Discussed cessation with patient. He endorses readiness. Has had significant issue with cold [place] cessation previously. Has not tried anything over the counter. Will start with Nicorette gum at a limited dose. Follow-up 1 month.

## 2017-01-08 NOTE — Progress Notes (Signed)
Pre visit review using our clinic review tool, if applicable. No additional management support is needed unless otherwise documented below in the visit note. 

## 2017-01-08 NOTE — Patient Instructions (Signed)
Please keep up the excellent work with your diet and exercise.  You are doing great! Keep a check on the blood pressure once or twice weekly.   Start the Nicorette gum on a rare as needed basis. This should replace the oral fixation from the chewing tobacco and help with nicotine dependence.  Follow-up in 1 month.

## 2017-02-05 ENCOUNTER — Ambulatory Visit (INDEPENDENT_AMBULATORY_CARE_PROVIDER_SITE_OTHER): Payer: Federal, State, Local not specified - PPO | Admitting: Physician Assistant

## 2017-02-05 ENCOUNTER — Encounter: Payer: Self-pay | Admitting: Physician Assistant

## 2017-02-05 VITALS — BP 118/82 | HR 58 | Temp 98.0°F | Resp 14 | Ht 74.5 in | Wt 226.0 lb

## 2017-02-05 DIAGNOSIS — R7989 Other specified abnormal findings of blood chemistry: Secondary | ICD-10-CM

## 2017-02-05 DIAGNOSIS — Z720 Tobacco use: Secondary | ICD-10-CM

## 2017-02-05 LAB — TESTOSTERONE: TESTOSTERONE: 386.28 ng/dL (ref 300.00–890.00)

## 2017-02-05 NOTE — Patient Instructions (Signed)
Please go to the lab today so we can assess testosterone levels. Now that vacation is almost over, try to get back on track with diet, exercise and use of the Nicorette gum.   We will follow-up in 3 months to see how you are doing.   I will call you with your lab results. We will alter your regimen accordingly.

## 2017-02-05 NOTE — Progress Notes (Signed)
Pre visit review using our clinic review tool, if applicable. No additional management support is needed unless otherwise documented below in the visit note. 

## 2017-02-05 NOTE — Assessment & Plan Note (Signed)
Noted history of prior decreased levels. Will recheck today at patient's request.

## 2017-02-05 NOTE — Assessment & Plan Note (Signed)
Improvement when using Nicorette gum. Patient to resume usage now that vacation is over. Will monitor at subsequent visits.

## 2017-02-05 NOTE — Progress Notes (Signed)
Patient presents to clinic today for follow-up of tobacco cessation efforts. At last visit, patient was started on Nicorette gum. Patient endorses starting as directed with great results. States he got off of the gum while on vacation for 2 weeks, after which there was a resumption in use of tobacco product. Is ready to restart regimen.  Patient also requesting testosterone levels this morning 2/2 history of mildly decreased levels. Notes difficulty making gains at the gym despite working out. Also notes fatigue.   Past Medical History:  Diagnosis Date  . History of chickenpox   . Kidney stones     Current Outpatient Prescriptions on File Prior to Visit  Medication Sig Dispense Refill  . nicotine polacrilex (NICORETTE) 4 MG gum Take 1 each (4 mg total) by mouth as needed for smoking cessation. (Patient not taking: Reported on 02/05/2017) 100 tablet 0   No current facility-administered medications on file prior to visit.     No Known Allergies  Family History  Problem Relation Age of Onset  . Cancer Mother        Leiomyosarcoma  . Diabetes Father   . Multiple sclerosis Sister   . Healthy Son        6 years old  . Cancer Maternal Uncle        Lung  . Cancer Paternal Grandmother        Breast  . Heart disease Paternal Grandfather   . Healthy Sister   . Healthy Sister     Social History   Social History  . Marital status: Married    Spouse name: Joycelyn Schmid  . Number of children: 1  . Years of education: 54   Occupational History  . Mechanic    Social History Main Topics  . Smoking status: Never Smoker  . Smokeless tobacco: Current User    Types: Chew  . Alcohol use Yes     Comment: 6-10 beers per week  . Drug use: No  . Sexual activity: Yes    Birth control/ protection: IUD     Comment: Mirena   Other Topics Concern  . None   Social History Narrative  . None   Review of Systems - See HPI.  All other ROS are negative.  BP 118/82   Pulse (!) 58   Temp 98  F (36.7 C) (Oral)   Resp 14   Ht 6' 2.5" (1.892 m)   Wt 226 lb (102.5 kg)   SpO2 98%   BMI 28.63 kg/m   Physical Exam  Constitutional: He is oriented to person, place, and time and well-developed, well-nourished, and in no distress.  HENT:  Head: Normocephalic and atraumatic.  Eyes: Conjunctivae are normal.  Neck: Neck supple.  Cardiovascular: Normal rate, regular rhythm, normal heart sounds and intact distal pulses.   Pulmonary/Chest: Effort normal and breath sounds normal. No respiratory distress. He has no wheezes. He has no rales. He exhibits no tenderness.  Neurological: He is alert and oriented to person, place, and time.  Skin: Skin is warm and dry. No rash noted.  Psychiatric: Affect normal.  Vitals reviewed.  Recent Results (from the past 2160 hour(s))  CBC     Status: None   Collection Time: 11/27/16 11:15 AM  Result Value Ref Range   WBC 7.8 4.0 - 10.5 K/uL   RBC 5.60 4.22 - 5.81 Mil/uL   Platelets 250.0 150.0 - 400.0 K/uL   Hemoglobin 16.7 13.0 - 17.0 g/dL   HCT 49.6 39.0 -  52.0 %   MCV 88.5 78.0 - 100.0 fl   MCHC 33.7 30.0 - 36.0 g/dL   RDW 13.0 11.5 - 15.5 %  Comprehensive metabolic panel     Status: Abnormal   Collection Time: 11/27/16 11:15 AM  Result Value Ref Range   Sodium 140 135 - 145 mEq/L   Potassium 4.2 3.5 - 5.1 mEq/L   Chloride 104 96 - 112 mEq/L   CO2 29 19 - 32 mEq/L   Glucose, Bld 83 70 - 99 mg/dL   BUN 16 6 - 23 mg/dL   Creatinine, Ser 0.93 0.40 - 1.50 mg/dL   Total Bilirubin 1.3 (H) 0.2 - 1.2 mg/dL   Alkaline Phosphatase 56 39 - 117 U/L   AST 15 0 - 37 U/L   ALT 23 0 - 53 U/L   Total Protein 7.3 6.0 - 8.3 g/dL   Albumin 4.8 3.5 - 5.2 g/dL   Calcium 9.9 8.4 - 10.5 mg/dL   GFR 96.95 >60.00 mL/min  Hemoglobin A1c     Status: None   Collection Time: 11/27/16 11:15 AM  Result Value Ref Range   Hgb A1c MFr Bld 5.2 4.6 - 6.5 %    Comment: Glycemic Control Guidelines for People with Diabetes:Non Diabetic:  <6%Goal of Therapy:  <7%Additional Action Suggested:  >8%   Lipid panel     Status: Abnormal   Collection Time: 11/27/16 11:15 AM  Result Value Ref Range   Cholesterol 243 (H) 0 - 200 mg/dL    Comment: ATP III Classification       Desirable:  < 200 mg/dL               Borderline High:  200 - 239 mg/dL          High:  > = 240 mg/dL   Triglycerides 148.0 0.0 - 149.0 mg/dL    Comment: Normal:  <150 mg/dLBorderline High:  150 - 199 mg/dL   HDL 43.40 >39.00 mg/dL   VLDL 29.6 0.0 - 40.0 mg/dL   LDL Cholesterol 170 (H) 0 - 99 mg/dL   Total CHOL/HDL Ratio 6     Comment:                Men          Women1/2 Average Risk     3.4          3.3Average Risk          5.0          4.42X Average Risk          9.6          7.13X Average Risk          15.0          11.0                       NonHDL 199.25     Comment: NOTE:  Non-HDL goal should be 30 mg/dL higher than patient's LDL goal (i.e. LDL goal of < 70 mg/dL, would have non-HDL goal of < 100 mg/dL)  TSH     Status: None   Collection Time: 11/27/16 11:15 AM  Result Value Ref Range   TSH 0.73 0.35 - 4.50 uIU/mL  Urinalysis, Routine w reflex microscopic     Status: Abnormal   Collection Time: 11/27/16 11:15 AM  Result Value Ref Range   Color, Urine YELLOW Yellow;Lt. Yellow   APPearance Sl Cloudy (A) Clear  Specific Gravity, Urine 1.020 1.000 - 1.030   pH 7.0 5.0 - 8.0   Total Protein, Urine NEGATIVE Negative   Urine Glucose NEGATIVE Negative   Ketones, ur NEGATIVE Negative   Bilirubin Urine NEGATIVE Negative   Hgb urine dipstick NEGATIVE Negative   Urobilinogen, UA 0.2 0.0 - 1.0   Leukocytes, UA NEGATIVE Negative   Nitrite NEGATIVE Negative   WBC, UA 0-2/hpf 0-2/hpf   RBC / HPF none seen 0-2/hpf   Squamous Epithelial / LPF Rare(0-4/hpf) Rare(0-4/hpf)    Assessment/Plan: Low testosterone Noted history of prior decreased levels. Will recheck today at patient's request.   Chewing tobacco use Improvement when using Nicorette gum. Patient to resume usage now  that vacation is over. Will monitor at subsequent visits.     Leeanne Rio, PA-C

## 2017-04-30 ENCOUNTER — Ambulatory Visit: Payer: Federal, State, Local not specified - PPO | Admitting: Physician Assistant

## 2017-04-30 DIAGNOSIS — Z0289 Encounter for other administrative examinations: Secondary | ICD-10-CM

## 2017-05-07 ENCOUNTER — Ambulatory Visit: Payer: Federal, State, Local not specified - PPO | Admitting: Physician Assistant

## 2017-05-28 ENCOUNTER — Encounter: Payer: Self-pay | Admitting: Physician Assistant

## 2017-05-28 ENCOUNTER — Other Ambulatory Visit: Payer: Self-pay

## 2017-05-28 ENCOUNTER — Ambulatory Visit: Payer: Federal, State, Local not specified - PPO | Admitting: Physician Assistant

## 2017-05-28 VITALS — BP 124/84 | HR 66 | Temp 98.2°F | Resp 14 | Ht 74.5 in | Wt 234.0 lb

## 2017-05-28 DIAGNOSIS — Z23 Encounter for immunization: Secondary | ICD-10-CM

## 2017-05-28 DIAGNOSIS — Z720 Tobacco use: Secondary | ICD-10-CM | POA: Diagnosis not present

## 2017-05-28 DIAGNOSIS — E785 Hyperlipidemia, unspecified: Secondary | ICD-10-CM | POA: Diagnosis not present

## 2017-05-28 MED ORDER — BUPROPION HCL ER (SMOKING DET) 150 MG PO TB12: 150.0000 mg | ORAL_TABLET | Freq: Two times a day (BID) | ORAL | 1 refills | Status: DC

## 2017-05-28 NOTE — Assessment & Plan Note (Signed)
No major changes since last visit. Is not wanting to start medication at present. No need to repeat labs today as such. He has agreed to work harder on diet and exercise now that he will not have to go out of town as often. Will start a 52-month trial of TLC. Will repeat lipids in 6 months.

## 2017-05-28 NOTE — Assessment & Plan Note (Signed)
Flu shot updated today. 

## 2017-05-28 NOTE — Progress Notes (Signed)
Patient presents to clinic today for 44-month follow-up regarding tobacco abuse. At last visit, patient was started on Nicorette to help with cessation. Patient endorses backsliding with exercise regimen. Is very hard to exercise due to schedule. Has worked on diet as much as possible with having to be in [place] for work.  Patient is still chewing about 2 cans of tobacco per day. States that the gum was ineffective. Would like to discuss other options today.   Past Medical History:  Diagnosis Date  . History of chickenpox   . Kidney stones     No current outpatient medications on file prior to visit.   No current facility-administered medications on file prior to visit.     No Known Allergies  Family History  Problem Relation Age of Onset  . Cancer Mother        Leiomyosarcoma  . Diabetes Father   . Multiple sclerosis Sister   . Healthy Son        54 years old  . Cancer Maternal Uncle        Lung  . Cancer Paternal Grandmother        Breast  . Heart disease Paternal Grandfather   . Healthy Sister   . Healthy Sister     Social History   Socioeconomic History  . Marital status: Married    Spouse name: Joycelyn Schmid  . Number of children: 1  . Years of education: 73  . Highest education level: None  Social Needs  . Financial resource strain: None  . Food insecurity - worry: None  . Food insecurity - inability: None  . Transportation needs - medical: None  . Transportation needs - non-medical: None  Occupational History  . Occupation: Dealer  Tobacco Use  . Smoking status: Never Smoker  . Smokeless tobacco: Current User    Types: Chew  Substance and Sexual Activity  . Alcohol use: Yes    Comment: 6-10 beers per week  . Drug use: No  . Sexual activity: Yes    Birth control/protection: IUD    Comment: Mirena  Other Topics Concern  . None  Social History Narrative  . None   Review of Systems - See HPI.  All other ROS are negative.  BP 124/84   Pulse 66    Temp 98.2 F (36.8 C) (Oral)   Resp 14   Ht 6' 2.5" (1.892 m)   Wt 234 lb (106.1 kg)   SpO2 98%   BMI 29.64 kg/m   Physical Exam  Constitutional: He is oriented to person, place, and time and well-developed, well-nourished, and in no distress.  HENT:  Head: Normocephalic and atraumatic.  Eyes: Conjunctivae are normal.  Neck: Neck supple.  Cardiovascular: Normal rate, regular rhythm, normal heart sounds and intact distal pulses.  Pulmonary/Chest: Effort normal and breath sounds normal. No respiratory distress. He has no wheezes. He has no rales. He exhibits no tenderness.  Neurological: He is alert and oriented to person, place, and time.  Skin: Skin is warm and dry. No rash noted.  Psychiatric: Affect normal.  Vitals reviewed.  Assessment/Plan: Hyperlipidemia No major changes since last visit. Is not wanting to start medication at present. No need to repeat labs today as such. He has agreed to work harder on diet and exercise now that he will not have to go out of town as often. Will start a 49-month trial of TLC. Will repeat lipids in 6 months.  Need for immunization against influenza Flu shot  updated today  Chewing tobacco use Discussed Wellbutrin as Chantix not proven to help with smokeless tobacco cessation. Will start 150 mg SR BID. Follow-up 4-6 weeks.     Leeanne Rio, PA-C

## 2017-05-28 NOTE — Assessment & Plan Note (Signed)
Discussed Wellbutrin as Chantix not proven to help with smokeless tobacco cessation. Will start 150 mg SR BID. Follow-up 4-6 weeks.

## 2017-05-28 NOTE — Patient Instructions (Signed)
Please start the Wellbutrin as directed to help with chewing tobacco cessation.   Keep working on diet and exercise. Goal is 150 minutes or more per week of moderate intensity aerobic exercise.  - walk during lunch break - always take stairs - park further away from building - doing a sport or physical activity that you enjoy - yoga is also a great option -- helps flexibility and core strength.  Follow-up with me in 6-8 weeks for reassessment of tobacco use. We will follow-up in 6 months regarding cholesterol.

## 2017-05-28 NOTE — Progress Notes (Signed)
Pre visit review using our clinic review tool, if applicable. No additional management support is needed unless otherwise documented below in the visit note. 

## 2017-07-16 ENCOUNTER — Ambulatory Visit: Payer: Federal, State, Local not specified - PPO | Admitting: Physician Assistant

## 2017-07-16 DIAGNOSIS — Z0289 Encounter for other administrative examinations: Secondary | ICD-10-CM

## 2017-09-15 ENCOUNTER — Ambulatory Visit: Payer: Federal, State, Local not specified - PPO | Admitting: Physician Assistant

## 2017-09-15 ENCOUNTER — Encounter: Payer: Self-pay | Admitting: Physician Assistant

## 2017-09-15 ENCOUNTER — Other Ambulatory Visit: Payer: Self-pay

## 2017-09-15 VITALS — BP 138/88 | HR 82 | Temp 97.9°F | Resp 16 | Ht 74.5 in | Wt 239.0 lb

## 2017-09-15 DIAGNOSIS — D225 Melanocytic nevi of trunk: Secondary | ICD-10-CM

## 2017-09-15 DIAGNOSIS — L989 Disorder of the skin and subcutaneous tissue, unspecified: Secondary | ICD-10-CM | POA: Insufficient documentation

## 2017-09-15 DIAGNOSIS — Z720 Tobacco use: Secondary | ICD-10-CM | POA: Diagnosis not present

## 2017-09-15 DIAGNOSIS — L918 Other hypertrophic disorders of the skin: Secondary | ICD-10-CM | POA: Diagnosis not present

## 2017-09-15 MED ORDER — VARENICLINE TARTRATE 0.5 MG X 11 & 1 MG X 42 PO MISC: ORAL | 0 refills | Status: DC

## 2017-09-15 NOTE — Patient Instructions (Signed)
Please keep skin clean and dry. Apply neosporin to the area on the back for a couple of days.   Please start the Chantix and use as directed.  Follow-up in May for a complete physical.

## 2017-09-15 NOTE — Progress Notes (Signed)
Patient presents to clinic today for follow-up regarding smokeless tobacco use and cessation. At last visit patient was started on a trial of Wellbutrin. Endorses taking as directed. Notes tolerating without side effect but did not note any improvement. Has also tried Nicorette Gum and Nicoderm CQ without improvement. Would like to discuss other options.   Patient also notes 2 lesions he would like looked at. (1) Endorses a skin tag of left anterior collarbone region that is irritated by clothing. Denies pain, itch or change in lesion. (2) Notes a larger mole-like lesion on left lower back that was recently irritated by clothing causing bleeding. Has had this lesion for several years. Feels it may be getting larger. Denies any history of skin cancer. Denies history of blistering sunburn.   Past Medical History:  Diagnosis Date  . History of chickenpox   . Kidney stones     No current outpatient medications on file prior to visit.   No current facility-administered medications on file prior to visit.     No Known Allergies  Family History  Problem Relation Age of Onset  . Cancer Mother        Leiomyosarcoma  . Diabetes Father   . Multiple sclerosis Sister   . Healthy Son        52 years old  . Cancer Maternal Uncle        Lung  . Cancer Paternal Grandmother        Breast  . Heart disease Paternal Grandfather   . Healthy Sister   . Healthy Sister     Social History   Socioeconomic History  . Marital status: Married    Spouse name: Joycelyn Schmid  . Number of children: 1  . Years of education: 72  . Highest education level: None  Social Needs  . Financial resource strain: None  . Food insecurity - worry: None  . Food insecurity - inability: None  . Transportation needs - medical: None  . Transportation needs - non-medical: None  Occupational History  . Occupation: Dealer  Tobacco Use  . Smoking status: Never Smoker  . Smokeless tobacco: Current User    Types: Chew    Substance and Sexual Activity  . Alcohol use: Yes    Comment: 6-10 beers per week  . Drug use: No  . Sexual activity: Yes    Birth control/protection: IUD    Comment: Mirena  Other Topics Concern  . None  Social History Narrative  . None   Review of Systems - See HPI.  All other ROS are negative.  BP 138/88   Pulse 82   Temp 97.9 F (36.6 C) (Oral)   Resp 16   Ht 6' 2.5" (1.892 m)   Wt 239 lb (108.4 kg)   SpO2 96%   BMI 30.28 kg/m   Physical Exam  Constitutional: He is well-developed, well-nourished, and in no distress.  HENT:  Head: Normocephalic and atraumatic.  Eyes: Conjunctivae are normal.  Neck: Neck supple.  Cardiovascular: Normal rate, regular rhythm and intact distal pulses.  Pulmonary/Chest: Effort normal and breath sounds normal. No respiratory distress. He has no wheezes. He has no rales. He exhibits no tenderness.  Neurological: He is alert.  Skin: Skin is warm and dry.     Psychiatric: Affect normal.  Vitals reviewed.  Assessment/Plan: Skin tag Area prepped with betadine. Cold Ease spray applied to the base of skin tag as topical anesthesia. Lesion removed with scissors. No bleeding noted. Small amount of triple  antibiotic ointment applied to the area. Tolerated very well. Wound care discussed with patient.  Skin lesion of back Area prepped with betadine. Local anesthesia applied to base of lesion with 1% lidocaine with epinephrine. Lesion shaved off with 10 blade. Mild bleeding noted. Hemostasis achieved with pressure and small amount of silver nitrate. Area cleaned, topical antibiotic ointment used and bandage applied. Lesion sent for pathology. Wound care reviewed with patient.   Chewing tobacco use Discussed options for treatment. Failed trial of Nicoderm, Nicorette and Wellbutrin. Will attempt trial of Chantix although data for smokeless tobacco cessation is limited as discussed with patient. Close follow-up scheduled.     Leeanne Rio,  PA-C

## 2017-09-15 NOTE — Assessment & Plan Note (Signed)
Discussed options for treatment. Failed trial of Nicoderm, Nicorette and Wellbutrin. Will attempt trial of Chantix although data for smokeless tobacco cessation is limited as discussed with patient. Close follow-up scheduled.

## 2017-09-15 NOTE — Assessment & Plan Note (Signed)
Area prepped with betadine. Cold Ease spray applied to the base of skin tag as topical anesthesia. Lesion removed with scissors. No bleeding noted. Small amount of triple antibiotic ointment applied to the area. Tolerated very well. Wound care discussed with patient.

## 2017-09-15 NOTE — Assessment & Plan Note (Signed)
Area prepped with betadine. Local anesthesia applied to base of lesion with 1% lidocaine with epinephrine. Lesion shaved off with 10 blade. Mild bleeding noted. Hemostasis achieved with pressure and small amount of silver nitrate. Area cleaned, topical antibiotic ointment used and bandage applied. Lesion sent for pathology. Wound care reviewed with patient.

## 2017-11-26 ENCOUNTER — Encounter: Payer: Self-pay | Admitting: Physician Assistant

## 2017-11-26 ENCOUNTER — Ambulatory Visit: Payer: Federal, State, Local not specified - PPO | Admitting: Physician Assistant

## 2017-11-26 ENCOUNTER — Other Ambulatory Visit: Payer: Self-pay

## 2017-11-26 VITALS — BP 110/76 | HR 70 | Temp 97.8°F | Resp 14 | Ht 74.5 in | Wt 235.0 lb

## 2017-11-26 DIAGNOSIS — E785 Hyperlipidemia, unspecified: Secondary | ICD-10-CM

## 2017-11-26 LAB — LIPID PANEL
CHOL/HDL RATIO: 4
Cholesterol: 196 mg/dL (ref 0–200)
HDL: 43.6 mg/dL (ref >39.00)
LDL Cholesterol: 129 mg/dL — ABNORMAL HIGH (ref 0–99)
NONHDL: 152.42
TRIGLYCERIDES: 116 mg/dL (ref 0.0–149.0)
VLDL: 23.2 mg/dL (ref 0.0–40.0)

## 2017-11-26 NOTE — Assessment & Plan Note (Signed)
Has made changes with diet and is trying to work hard on diet.  Will recheck fasting lipids today.

## 2017-11-26 NOTE — Progress Notes (Signed)
Patient presents to clinic today for follow-up of hyperlipidemia after 6 months of working on lifestyle changes. Patient endorses working very hard on diet. Is making better food choices and watching portions. Is running twice per week and is walking more daily.    Past Medical History:  Diagnosis Date  . History of chickenpox   . Kidney stones     Current Outpatient Medications on File Prior to Visit  Medication Sig Dispense Refill  . varenicline (CHANTIX PAK) 0.5 MG X 11 & 1 MG X 42 tablet Take one 0.5 mg tablet by mouth once daily for 3 days, then increase to one 0.5 mg tablet twice daily for 4 days, then increase to one 1 mg tablet twice daily. (Patient not taking: Reported on 11/26/2017) 53 tablet 0   No current facility-administered medications on file prior to visit.     No Known Allergies  Family History  Problem Relation Age of Onset  . Cancer Mother        Leiomyosarcoma  . Diabetes Father   . Multiple sclerosis Sister   . Healthy Son        46 years old  . Cancer Maternal Uncle        Lung  . Cancer Paternal Grandmother        Breast  . Heart disease Paternal Grandfather   . Healthy Sister   . Healthy Sister     Social History   Socioeconomic History  . Marital status: Married    Spouse name: Joycelyn Schmid  . Number of children: 1  . Years of education: 78  . Highest education level: Not on file  Occupational History  . Occupation: Manufacturing systems engineer  . Financial resource strain: Not on file  . Food insecurity:    Worry: Not on file    Inability: Not on file  . Transportation needs:    Medical: Not on file    Non-medical: Not on file  Tobacco Use  . Smoking status: Never Smoker  . Smokeless tobacco: Current User    Types: Chew  Substance and Sexual Activity  . Alcohol use: Yes    Comment: 6-10 beers per week  . Drug use: No  . Sexual activity: Yes    Birth control/protection: IUD    Comment: Mirena  Lifestyle  . Physical activity:    Days  per week: Not on file    Minutes per session: Not on file  . Stress: Not on file  Relationships  . Social connections:    Talks on phone: Not on file    Gets together: Not on file    Attends religious service: Not on file    Active member of club or organization: Not on file    Attends meetings of clubs or organizations: Not on file    Relationship status: Not on file  Other Topics Concern  . Not on file  Social History Narrative  . Not on file   Review of Systems - See HPI.  All other ROS are negative.  BP 110/76   Pulse 70   Temp 97.8 F (36.6 C) (Oral)   Resp 14   Ht 6' 2.5" (1.892 m)   Wt 235 lb (106.6 kg)   SpO2 98%   BMI 29.77 kg/m   Physical Exam  Constitutional: He is oriented to person, place, and time. He appears well-developed and well-nourished.  HENT:  Head: Normocephalic and atraumatic.  Cardiovascular: Normal rate, regular rhythm and normal heart  sounds.  Pulmonary/Chest: Effort normal.  Neurological: He is alert and oriented to person, place, and time.  Skin: Skin is warm.  Psychiatric: He has a normal mood and affect.  Nursing note and vitals reviewed.  Assessment/Plan: Hyperlipidemia Has made changes with diet and is trying to work hard on diet.  Will recheck fasting lipids today.    Leeanne Rio, PA-C

## 2017-11-26 NOTE — Patient Instructions (Signed)
Please go to the lab today for blood work.  I will call you with your results. We will alter treatment regimen(s) if indicated by your results.   Please keep up with the dietary and exercise changes! I am glad you are feeling better!

## 2018-05-02 DIAGNOSIS — T63444S Toxic effect of venom of bees, undetermined, sequela: Secondary | ICD-10-CM | POA: Diagnosis not present

## 2018-05-02 DIAGNOSIS — L989 Disorder of the skin and subcutaneous tissue, unspecified: Secondary | ICD-10-CM | POA: Diagnosis not present

## 2018-06-03 ENCOUNTER — Other Ambulatory Visit: Payer: Self-pay

## 2018-06-03 ENCOUNTER — Encounter: Payer: Self-pay | Admitting: Physician Assistant

## 2018-06-03 ENCOUNTER — Ambulatory Visit: Payer: Federal, State, Local not specified - PPO | Admitting: Physician Assistant

## 2018-06-03 VITALS — BP 148/92 | HR 89 | Temp 97.9°F | Resp 16 | Ht 74.5 in | Wt 253.0 lb

## 2018-06-03 DIAGNOSIS — F430 Acute stress reaction: Secondary | ICD-10-CM | POA: Diagnosis not present

## 2018-06-03 DIAGNOSIS — Z23 Encounter for immunization: Secondary | ICD-10-CM

## 2018-06-03 NOTE — Progress Notes (Signed)
Acute Office Visit  Subjective:    Patient ID: Jeffery Woodard, male    DOB: 1980-11-26, 37 y.o.   MRN: [id number]  Chief Complaint  Patient presents with  . Stress   HPI Patient is in today to discuss significant stressors present over the past few months causing anxiety and anhedonia. Denies depressed mood. Does note changes in sleep and appetite. Notes that he recently switched to night shift which has been an adjustment. Major stressor currently is father who is critically ill in a hospital in [place]. Notes he was found by patient's sister a couple of weeks ago at home, confused and on the floor. Was taken to ER where he was found to have a large brain tumor. Was scheduled for removal with Neurosurgery but had complication of intracranial bleed and hemorrhagic stroke. Has been in the ICU and has subsequently developed infection which is being treated. Patient has been driving to [place] to be with dad inbetween stretches of work shifts. Is feeling significantly guilty as he feels he cannot be there for his dad or his sisters. This is causing a lot of internal turmoil and anxiety. Is not sleeping well at night and is significantly exhausted. Has very limited caregiver FMLA through work which has run out. Is not sure what to do.   Past Medical History:  Diagnosis Date  . History of chickenpox   . Kidney stones     Past Surgical History:  Procedure Laterality Date  . NO PAST SURGERIES      Family History  Problem Relation Age of Onset  . Cancer Mother        Leiomyosarcoma  . Diabetes Father   . Multiple sclerosis Sister   . Healthy Son        52 years old  . Cancer Maternal Uncle        Lung  . Cancer Paternal Grandmother        Breast  . Heart disease Paternal Grandfather   . Healthy Sister   . Healthy Sister     Social History   Socioeconomic History  . Marital status: Married    Spouse name: Joycelyn Schmid  . Number of children: 1  . Years of education: 45  .  Highest education level: Not on file  Occupational History  . Occupation: Manufacturing systems engineer  . Financial resource strain: Not on file  . Food insecurity:    Worry: Not on file    Inability: Not on file  . Transportation needs:    Medical: Not on file    Non-medical: Not on file  Tobacco Use  . Smoking status: Never Smoker  . Smokeless tobacco: Current User    Types: Chew  Substance and Sexual Activity  . Alcohol use: Yes    Comment: 6-10 beers per week  . Drug use: No  . Sexual activity: Yes    Birth control/protection: IUD    Comment: Mirena  Lifestyle  . Physical activity:    Days per week: Not on file    Minutes per session: Not on file  . Stress: Not on file  Relationships  . Social connections:    Talks on phone: Not on file    Gets together: Not on file    Attends religious service: Not on file    Active member of club or organization: Not on file    Attends meetings of clubs or organizations: Not on file    Relationship status: Not on file  .  Intimate partner violence:    Fear of current or ex partner: Not on file    Emotionally abused: Not on file    Physically abused: Not on file    Forced sexual activity: Not on file  Other Topics Concern  . Not on file  Social History Narrative  . Not on file    Outpatient Medications Prior to Visit  Medication Sig Dispense Refill  . EPINEPHrine 0.3 mg/0.3 mL IJ SOAJ injection INJ 1 ML IM PRN UTD FOR BEE ALLERGY  0  . varenicline (CHANTIX PAK) 0.5 MG X 11 & 1 MG X 42 tablet Take one 0.5 mg tablet by mouth once daily for 3 days, then increase to one 0.5 mg tablet twice daily for 4 days, then increase to one 1 mg tablet twice daily. (Patient not taking: Reported on 11/26/2017) 53 tablet 0   No facility-administered medications prior to visit.    No Known Allergies  ROS Pertinent ROS are listed in HPI.  Objective:    Physical Exam  Constitutional: He is oriented to person, place, and time. He appears  well-developed and well-nourished.  HENT:  Head: Normocephalic and atraumatic.  Cardiovascular: Normal rate, regular rhythm, normal heart sounds and intact distal pulses.  Pulmonary/Chest: Effort normal and breath sounds normal. No respiratory distress. He has no wheezes. He has no rales. He exhibits no tenderness.  Neurological: He is alert and oriented to person, place, and time.  Psychiatric: His speech is normal and behavior is normal. Judgment and thought content normal. His mood appears anxious. His affect is not labile and not inappropriate. Cognition and memory are normal. He exhibits a depressed mood.  Vitals reviewed.  BP (!) 148/92   Pulse 89   Temp 97.9 F (36.6 C) (Oral)   Resp 16   Ht 6' 2.5" (1.892 m)   Wt 253 lb (114.8 kg)   SpO2 98%   BMI 32.05 kg/m  Wt Readings from Last 3 Encounters:  06/03/18 253 lb (114.8 kg)  11/26/17 235 lb (106.6 kg)  09/15/17 239 lb (108.4 kg)    Health Maintenance Due  Topic Date Due  . INFLUENZA VACCINE  02/12/2018    There are no preventive care reminders to display for this patient.   Lab Results  Component Value Date   TSH 0.73 11/27/2016   Lab Results  Component Value Date   WBC 7.8 11/27/2016   HGB 16.7 11/27/2016   HCT 49.6 11/27/2016   MCV 88.5 11/27/2016   PLT 250.0 11/27/2016   Lab Results  Component Value Date   NA 140 11/27/2016   K 4.2 11/27/2016   CO2 29 11/27/2016   GLUCOSE 83 11/27/2016   BUN 16 11/27/2016   CREATININE 0.93 11/27/2016   BILITOT 1.3 (H) 11/27/2016   ALKPHOS 56 11/27/2016   AST 15 11/27/2016   ALT 23 11/27/2016   PROT 7.3 11/27/2016   ALBUMIN 4.8 11/27/2016   CALCIUM 9.9 11/27/2016   ANIONGAP 7 06/17/2015   GFR 96.95 11/27/2016   Lab Results  Component Value Date   CHOL 196 11/26/2017   Lab Results  Component Value Date   HDL 43.60 11/26/2017   Lab Results  Component Value Date   LDLCALC 129 (H) 11/26/2017   Lab Results  Component Value Date   TRIG 116.0 11/26/2017    Lab Results  Component Value Date   CHOLHDL 4 11/26/2017   Lab Results  Component Value Date   HGBA1C 5.2 11/27/2016  Assessment & Plan:   1. Acute stress reaction Multiple recent stressors, mostly revolving around fathers critical illness and that he is so far away. Is affecting sleep, appetite. Noting feelings of significant guilt. Reviewed counseling and stress relief tactics. No medication presently as we are working on self-care measures and counseling first. Will complete FMLA -- plan to take patient out for 2 weeks while we work on some things. Thankfully no SI/HI presently.   2. Need for immunization against influenza - Flu Vaccine QUAD 36+ mos IM   No orders of the defined types were placed in this encounter.    Leeanne Rio, PA-C

## 2018-06-03 NOTE — Patient Instructions (Signed)
Please keep hydrated and try to get plenty of rest.  Download the Calm app on your smart phone. It will help you with stress relief. This is a good idea to listen to in the morning and evening.   Have your HR representative send me FMLA paperwork for completion. Fax number -- [phone]. We will start leave on Sunday 06/07/18 for 2 weeks.

## 2018-06-04 ENCOUNTER — Telehealth: Payer: Self-pay | Admitting: Physician Assistant

## 2018-06-04 NOTE — Telephone Encounter (Signed)
FMLA paperwork in provider folder for completion

## 2018-06-04 NOTE — Telephone Encounter (Signed)
We received FMLA forms for the pt, I have placed them in the bin upfront with a charge sheet. Pt would like to pick these forms up in the office once competed.

## 2018-06-08 NOTE — Telephone Encounter (Signed)
I have made pt aware and he will have his wife pick up the forms.

## 2018-06-08 NOTE — Telephone Encounter (Signed)
Forms completed. At front desk for pick up. No charge.   Please contact patient.

## 2018-06-19 ENCOUNTER — Ambulatory Visit: Payer: Federal, State, Local not specified - PPO | Admitting: Physician Assistant

## 2018-06-19 ENCOUNTER — Encounter: Payer: Self-pay | Admitting: Physician Assistant

## 2018-06-19 ENCOUNTER — Other Ambulatory Visit: Payer: Self-pay

## 2018-06-19 VITALS — BP 132/78 | HR 89 | Temp 97.8°F | Resp 16 | Ht 74.5 in | Wt 247.0 lb

## 2018-06-19 DIAGNOSIS — F430 Acute stress reaction: Secondary | ICD-10-CM

## 2018-06-19 NOTE — Progress Notes (Signed)
Patient presents to clinic today for follow-up regarding acute stress reaction secondary to father's health and numerous stressors causing anxiety. Has been out of work the past week on FMLA. Notes that father's condition is markedly improved. Has been moved to a rehabilitation facility for care now that he has finally been discharged from the hospital. This has caused a significant of stress to be lifted from patient. Notes he is feeling ready to return to work. Needs a return to work note in order to start.  Past Medical History:  Diagnosis Date  . History of chickenpox   . Kidney stones     Current Outpatient Medications on File Prior to Visit  Medication Sig Dispense Refill  . EPINEPHrine 0.3 mg/0.3 mL IJ SOAJ injection INJ 1 ML IM PRN UTD FOR BEE ALLERGY  0   No current facility-administered medications on file prior to visit.     No Known Allergies  Family History  Problem Relation Age of Onset  . Cancer Mother        Leiomyosarcoma  . Diabetes Father   . Multiple sclerosis Sister   . Healthy Son        75 years old  . Cancer Maternal Uncle        Lung  . Cancer Paternal Grandmother        Breast  . Heart disease Paternal Grandfather   . Healthy Sister   . Healthy Sister     Social History   Socioeconomic History  . Marital status: Married    Spouse name: Joycelyn Schmid  . Number of children: 1  . Years of education: 53  . Highest education level: Not on file  Occupational History  . Occupation: Manufacturing systems engineer  . Financial resource strain: Not on file  . Food insecurity:    Worry: Not on file    Inability: Not on file  . Transportation needs:    Medical: Not on file    Non-medical: Not on file  Tobacco Use  . Smoking status: Never Smoker  . Smokeless tobacco: Current User    Types: Chew  Substance and Sexual Activity  . Alcohol use: Yes    Comment: 6-10 beers per week  . Drug use: No  . Sexual activity: Yes    Birth control/protection: IUD   Comment: Mirena  Lifestyle  . Physical activity:    Days per week: Not on file    Minutes per session: Not on file  . Stress: Not on file  Relationships  . Social connections:    Talks on phone: Not on file    Gets together: Not on file    Attends religious service: Not on file    Active member of club or organization: Not on file    Attends meetings of clubs or organizations: Not on file    Relationship status: Not on file  Other Topics Concern  . Not on file  Social History Narrative  . Not on file   Review of Systems - See HPI.  All other ROS are negative.  BP 132/78   Pulse 89   Temp 97.8 F (36.6 C) (Oral)   Resp 16   Ht 6' 2.5" (1.892 m)   Wt 247 lb (112 kg)   SpO2 98%   BMI 31.29 kg/m   Physical Exam  Constitutional: He appears well-developed and well-nourished.  HENT:  Head: Normocephalic and atraumatic.  Cardiovascular: Normal rate, regular rhythm, normal heart sounds and intact distal pulses.  Vitals reviewed.  Assessment/Plan: 1. Acute stress reaction Calming down now. Mood is much better and stable. Patient cleared to return to work. Note written for patient to turn in to employer.    Leeanne Rio, PA-C

## 2018-07-02 ENCOUNTER — Encounter: Payer: Self-pay | Admitting: Physician Assistant

## 2018-07-03 ENCOUNTER — Telehealth: Payer: Self-pay | Admitting: Physician Assistant

## 2018-07-03 NOTE — Telephone Encounter (Signed)
Forms addended and given to CMA to fax and send e-mail copy to patient.

## 2018-07-03 NOTE — Telephone Encounter (Signed)
Pt dropped off FMLA forms, states more information is needed, placed in bin upfront w/charge sheet.

## 2018-07-03 NOTE — Telephone Encounter (Signed)
Forms have been faxed as well as emailed/mailed per patient request.

## 2018-07-03 NOTE — Telephone Encounter (Signed)
Forms have been placed in PCP's folder

## 2018-07-13 ENCOUNTER — Encounter: Payer: Self-pay | Admitting: Physician Assistant

## 2018-07-24 ENCOUNTER — Encounter: Payer: Self-pay | Admitting: Physician Assistant

## 2018-07-24 ENCOUNTER — Other Ambulatory Visit: Payer: Self-pay

## 2018-07-24 ENCOUNTER — Ambulatory Visit: Payer: Federal, State, Local not specified - PPO | Admitting: Physician Assistant

## 2018-07-24 VITALS — BP 120/82 | HR 72 | Temp 98.7°F | Resp 16 | Ht 74.5 in | Wt 250.0 lb

## 2018-07-24 DIAGNOSIS — J208 Acute bronchitis due to other specified organisms: Secondary | ICD-10-CM | POA: Diagnosis not present

## 2018-07-24 DIAGNOSIS — B9689 Other specified bacterial agents as the cause of diseases classified elsewhere: Secondary | ICD-10-CM | POA: Diagnosis not present

## 2018-07-24 MED ORDER — AMOXICILLIN 875 MG PO TABS
875.0000 mg | ORAL_TABLET | Freq: Two times a day (BID) | ORAL | 0 refills | Status: DC
Start: 2018-07-24 — End: 2018-09-18

## 2018-07-24 MED ORDER — BENZONATATE 100 MG PO CAPS: 100.0000 mg | ORAL_CAPSULE | Freq: Three times a day (TID) | ORAL | 0 refills | Status: DC | PRN

## 2018-07-24 NOTE — Patient Instructions (Signed)
Take antibiotic (Amoxicillin) as directed.  Increase fluids.  Get plenty of rest. Use Mucinex for congestion. Tessalon as directed for cough. Take a daily probiotic (I recommend Align or Culturelle, but even Activia Yogurt may be beneficial).  A humidifier placed in the bedroom may offer some relief for a dry, scratchy throat of nasal irritation.  Read information below on acute bronchitis. Please call or return to clinic if symptoms are not improving.  Acute Bronchitis Bronchitis is when the airways that extend from the windpipe into the lungs get red, puffy, and painful (inflamed). Bronchitis often causes thick spit (mucus) to develop. This leads to a cough. A cough is the most common symptom of bronchitis. In acute bronchitis, the condition usually begins suddenly and goes away over time (usually in 2 weeks). Smoking, allergies, and asthma can make bronchitis worse. Repeated episodes of bronchitis may cause more lung problems.  HOME CARE  Rest.  Drink enough fluids to keep your pee (urine) clear or pale yellow (unless you need to limit fluids as told by your doctor).  Only take over-the-counter or prescription medicines as told by your doctor.  Avoid smoking and secondhand smoke. These can make bronchitis worse. If you are a smoker, think about using nicotine gum or skin patches. Quitting smoking will help your lungs heal faster.  Reduce the chance of getting bronchitis again by:  Washing your hands often.  Avoiding people with cold symptoms.  Trying not to touch your hands to your mouth, nose, or eyes.  Follow up with your doctor as told.  GET HELP IF: Your symptoms do not improve after 1 week of treatment. Symptoms include:  Cough.  Fever.  Coughing up thick spit.  Body aches.  Chest congestion.  Chills.  Shortness of breath.  Sore throat.  GET HELP RIGHT AWAY IF:   You have an increased fever.  You have chills.  You have severe shortness of breath.  You have  bloody thick spit (sputum).  You throw up (vomit) often.  You lose too much body fluid (dehydration).  You have a severe headache.  You faint.  MAKE SURE YOU:   Understand these instructions.  Will watch your condition.  Will get help right away if you are not doing well or get worse. Document Released: 12/18/2007 Document Revised: 03/03/2013 Document Reviewed: 12/22/2012 [facility] Patient Information 2015 [place], [place]. This information is not intended to replace advice given to you by your health care provider. Make sure you discuss any questions you have with your health care provider.

## 2018-07-24 NOTE — Progress Notes (Signed)
Acute Office Visit  Subjective:    Patient ID: Jeffery Woodard, male    DOB: 01-16-1981, 38 y.o.   MRN: [id number]  Chief Complaint  Patient presents with  . URI    Patient is in today c/o 4 weeks of nasal congestion, sinus pressure, sinus pain, ear pressure, cough that is mostly dry but sometimes productive of thin, yellow sputum, and fatigue. Denies chest pain, SOB. Denies fever. Denies recent travel or sick contact.   Past Medical History:  Diagnosis Date  . History of chickenpox   . Kidney stones     Past Surgical History:  Procedure Laterality Date  . NO PAST SURGERIES      Family History  Problem Relation Age of Onset  . Cancer Mother        Leiomyosarcoma  . Diabetes Father   . Multiple sclerosis Sister   . Healthy Son        30 years old  . Cancer Maternal Uncle        Lung  . Cancer Paternal Grandmother        Breast  . Heart disease Paternal Grandfather   . Healthy Sister   . Healthy Sister     Social History   Socioeconomic History  . Marital status: Married    Spouse name: Joycelyn Schmid  . Number of children: 1  . Years of education: 58  . Highest education level: Not on file  Occupational History  . Occupation: Manufacturing systems engineer  . Financial resource strain: Not on file  . Food insecurity:    Worry: Not on file    Inability: Not on file  . Transportation needs:    Medical: Not on file    Non-medical: Not on file  Tobacco Use  . Smoking status: Never Smoker  . Smokeless tobacco: Current User    Types: Chew  Substance and Sexual Activity  . Alcohol use: Yes    Comment: 6-10 beers per week  . Drug use: No  . Sexual activity: Yes    Birth control/protection: I.U.D.    Comment: Mirena  Lifestyle  . Physical activity:    Days per week: Not on file    Minutes per session: Not on file  . Stress: Not on file  Relationships  . Social connections:    Talks on phone: Not on file    Gets together: Not on file    Attends religious service:  Not on file    Active member of club or organization: Not on file    Attends meetings of clubs or organizations: Not on file    Relationship status: Not on file  . Intimate partner violence:    Fear of current or ex partner: Not on file    Emotionally abused: Not on file    Physically abused: Not on file    Forced sexual activity: Not on file  Other Topics Concern  . Not on file  Social History Narrative  . Not on file    Outpatient Medications Prior to Visit  Medication Sig Dispense Refill  . EPINEPHrine 0.3 mg/0.3 mL IJ SOAJ injection INJ 1 ML IM PRN UTD FOR BEE ALLERGY  0   No facility-administered medications prior to visit.     No Known Allergies  Review of Systems  Constitutional: Negative for chills, fever and malaise/fatigue.  HENT: Positive for congestion and rhinorrhea. Negative for sore throat.   Respiratory: Positive for cough. Negative for shortness of breath.   Cardiovascular:  Negative.   Gastrointestinal: Negative for diarrhea, nausea and vomiting.  Musculoskeletal: Negative for neck pain.  Neurological: Positive for headaches. Negative for dizziness and weakness.       Objective:    Physical Exam  Constitutional: He appears well-developed and well-nourished.  HENT:  Head: Normocephalic.  Right Ear: External ear and ear canal normal.  Ears:  Mouth/Throat: Uvula is midline and mucous membranes are normal. Posterior oropharyngeal erythema present.  Eyes: Pupils are equal, round, and reactive to light.  Neck: Neck supple.    Cardiovascular: Normal rate, regular rhythm and normal heart sounds.  Pulmonary/Chest: Effort normal and breath sounds normal.    BP 120/82   Pulse 72   Temp 98.7 F (37.1 C) (Oral)   Resp 16   Ht 6' 2.5" (1.892 m)   Wt 113.4 kg   SpO2 98%   BMI 31.67 kg/m  Wt Readings from Last 3 Encounters:  07/24/18 113.4 kg  06/19/18 112 kg  06/03/18 114.8 kg    There are no preventive care reminders to display for this  patient.  There are no preventive care reminders to display for this patient.   Lab Results  Component Value Date   TSH 0.73 11/27/2016   Lab Results  Component Value Date   WBC 7.8 11/27/2016   HGB 16.7 11/27/2016   HCT 49.6 11/27/2016   MCV 88.5 11/27/2016   PLT 250.0 11/27/2016   Lab Results  Component Value Date   NA 140 11/27/2016   K 4.2 11/27/2016   CO2 29 11/27/2016   GLUCOSE 83 11/27/2016   BUN 16 11/27/2016   CREATININE 0.93 11/27/2016   BILITOT 1.3 (H) 11/27/2016   ALKPHOS 56 11/27/2016   AST 15 11/27/2016   ALT 23 11/27/2016   PROT 7.3 11/27/2016   ALBUMIN 4.8 11/27/2016   CALCIUM 9.9 11/27/2016   ANIONGAP 7 06/17/2015   GFR 96.95 11/27/2016   Lab Results  Component Value Date   CHOL 196 11/26/2017   Lab Results  Component Value Date   HDL 43.60 11/26/2017   Lab Results  Component Value Date   LDLCALC 129 (H) 11/26/2017   Lab Results  Component Value Date   TRIG 116.0 11/26/2017   Lab Results  Component Value Date   CHOLHDL 4 11/26/2017   Lab Results  Component Value Date   HGBA1C 5.2 11/27/2016       Assessment & Plan:   1. Acute bacterial bronchitis Rx Augmentin.  Increase fluids.  Rest.  Saline nasal spray.  Probiotic.  Mucinex as directed.  Humidifier in bedroom. Tessalon per orders.  Call or return to clinic if symptoms are not improving.  - amoxicillin (AMOXIL) 875 MG tablet; Take 1 tablet (875 mg total) by mouth 2 (two) times daily.  Dispense: 14 tablet; Refill: 0 - benzonatate (TESSALON) 100 MG capsule; Take 1 capsule (100 mg total) by mouth 3 (three) times daily as needed for cough.  Dispense: 30 capsule; Refill: 0   No orders of the defined types were placed in this encounter.    Erin E Mecum, Student-PA

## 2018-08-04 ENCOUNTER — Other Ambulatory Visit: Payer: Self-pay | Admitting: Physician Assistant

## 2018-08-04 ENCOUNTER — Ambulatory Visit: Payer: Self-pay | Admitting: *Deleted

## 2018-08-04 DIAGNOSIS — Z20828 Contact with and (suspected) exposure to other viral communicable diseases: Secondary | ICD-10-CM

## 2018-08-04 MED ORDER — OSELTAMIVIR PHOSPHATE 75 MG PO CAPS: 75.0000 mg | ORAL_CAPSULE | Freq: Every day | ORAL | 0 refills | Status: DC

## 2018-08-04 NOTE — Telephone Encounter (Addendum)
Pt normally seen by Elyn Aquas, [facility]; he would like tamaflu because his son was diagnosed with influenza A on 08/04/2018; the pt states that he did get a flu shot this season; he would like his prescription sent to [facility]. [address] and [place]; he can be contacted at [phone]; the pt is normally seen by Elyn Aquas, [facility];  will route to office for final disposition.  Reason for Disposition . [1] Influenza EXPOSURE within last 48 hours (2 days) AND [2] NOT HIGH RISK AND [3] strongly requests antiviral medication  Answer Assessment - Initial Assessment Questions 1. TYPE of EXPOSURE: "How were you exposed?" (e.g., close contact, not a close contact)     Pt's son diagnosed with influenza A 1/21/220 2. DATE of EXPOSURE: "When did the exposure occur?" (e.g., hour, days, weeks)     days 3. PREGNANCY: "Is there any chance you are pregnant?" "When was your last menstrual period?"     n/a 4. HIGH RISK for COMPLICATIONS: "Do you have any heart or lung problems? Do you have a weakened immune system?" (e.g., CHF, COPD, asthma, HIV positive, chemotherapy, renal failure, diabetes mellitus, sickle cell anemia)     Recently treated for bronchitis 5. SYMPTOMS: "Do you have any symptoms?" (e.g., cough, fever, sore throat, difficulty breathing).     Cough; not sure if related to flu because he was coughing before his son was diagnosed  Protocols used: INFLUENZA EXPOSURE-A-AH

## 2018-08-04 NOTE — Telephone Encounter (Signed)
I will send in a preventative dose of Tamiflu for him to have.

## 2018-08-04 NOTE — Telephone Encounter (Signed)
Spoke with patient to let him know that medication has been called in.

## 2018-09-18 ENCOUNTER — Other Ambulatory Visit: Payer: Self-pay

## 2018-09-18 ENCOUNTER — Ambulatory Visit (INDEPENDENT_AMBULATORY_CARE_PROVIDER_SITE_OTHER): Payer: Federal, State, Local not specified - PPO | Admitting: Physician Assistant

## 2018-09-18 ENCOUNTER — Encounter: Payer: Self-pay | Admitting: Physician Assistant

## 2018-09-18 VITALS — BP 130/82 | HR 74 | Temp 98.0°F | Resp 16 | Ht 74.75 in | Wt 256.0 lb

## 2018-09-18 DIAGNOSIS — Z0000 Encounter for general adult medical examination without abnormal findings: Secondary | ICD-10-CM | POA: Diagnosis not present

## 2018-09-18 DIAGNOSIS — E785 Hyperlipidemia, unspecified: Secondary | ICD-10-CM | POA: Diagnosis not present

## 2018-09-18 LAB — CBC WITH DIFFERENTIAL/PLATELET
BASOS PCT: 0.4 % (ref 0.0–3.0)
Basophils Absolute: 0 10*3/uL (ref 0.0–0.1)
EOS ABS: 0.1 10*3/uL (ref 0.0–0.7)
Eosinophils Relative: 0.6 % (ref 0.0–5.0)
HEMATOCRIT: 48.5 % (ref 39.0–52.0)
HEMOGLOBIN: 16.6 g/dL (ref 13.0–17.0)
Lymphocytes Relative: 34.7 % (ref 12.0–46.0)
Lymphs Abs: 3.4 10*3/uL (ref 0.7–4.0)
MCHC: 34.3 g/dL (ref 30.0–36.0)
MCV: 87.2 fl (ref 78.0–100.0)
Monocytes Absolute: 0.8 10*3/uL (ref 0.1–1.0)
Monocytes Relative: 8.1 % (ref 3.0–12.0)
Neutro Abs: 5.6 10*3/uL (ref 1.4–7.7)
Neutrophils Relative %: 56.2 % (ref 43.0–77.0)
PLATELETS: 281 10*3/uL (ref 150.0–400.0)
RBC: 5.56 Mil/uL (ref 4.22–5.81)
RDW: 12.9 % (ref 11.5–15.5)
WBC: 9.9 10*3/uL (ref 4.0–10.5)

## 2018-09-18 LAB — LIPID PANEL
CHOLESTEROL: 227 mg/dL — AB (ref 0–200)
HDL: 40 mg/dL (ref >39.00)
NonHDL: 186.78
Total CHOL/HDL Ratio: 6
Triglycerides: 218 mg/dL — ABNORMAL HIGH (ref 0.0–149.0)
VLDL: 43.6 mg/dL — AB (ref 0.0–40.0)

## 2018-09-18 LAB — COMPREHENSIVE METABOLIC PANEL
ALBUMIN: 4.8 g/dL (ref 3.5–5.2)
ALT: 38 U/L (ref 0–53)
AST: 23 U/L (ref 0–37)
Alkaline Phosphatase: 67 U/L (ref 39–117)
BUN: 20 mg/dL (ref 6–23)
CALCIUM: 9.7 mg/dL (ref 8.4–10.5)
CHLORIDE: 103 meq/L (ref 96–112)
CO2: 26 meq/L (ref 19–32)
Creatinine, Ser: 0.98 mg/dL (ref 0.40–1.50)
GFR: 85.05 mL/min (ref >60.00)
Glucose, Bld: 87 mg/dL (ref 70–99)
POTASSIUM: 4.3 meq/L (ref 3.5–5.1)
SODIUM: 137 meq/L (ref 135–145)
Total Bilirubin: 1 mg/dL (ref 0.2–1.2)
Total Protein: 7.3 g/dL (ref 6.0–8.3)

## 2018-09-18 LAB — LDL CHOLESTEROL, DIRECT: Direct LDL: 159 mg/dL

## 2018-09-18 LAB — HEMOGLOBIN A1C: HEMOGLOBIN A1C: 5.1 % (ref 4.6–6.5)

## 2018-09-18 NOTE — Progress Notes (Signed)
Patient presents to clinic today for annual exam.  Patient is fasting for labs. Recent passing of father causing some significant stressors. Denies any depressed mood or anhedonia with this.  Notes fair sleep overall, giving he is now back on night shift.  Endorses well-balanced diet overall but needs to work on portion sizes.  Body mass index is 32.21 kg/m.  Health Maintenance: Immunizations -- up-to-date.  Past Medical History:  Diagnosis Date  . History of chickenpox   . Kidney stones     Past Surgical History:  Procedure Laterality Date  . NO PAST SURGERIES      Current Outpatient Medications on File Prior to Visit  Medication Sig Dispense Refill  . EPINEPHrine 0.3 mg/0.3 mL IJ SOAJ injection INJ 1 ML IM PRN UTD FOR BEE ALLERGY  0   No current facility-administered medications on file prior to visit.     No Known Allergies  Family History  Problem Relation Age of Onset  . Cancer Mother        Leiomyosarcoma  . Diabetes Father   . Multiple sclerosis Sister   . Healthy Son        31 years old  . Cancer Maternal Uncle        Lung  . Cancer Paternal Grandmother        Breast  . Heart disease Paternal Grandfather   . Healthy Sister   . Healthy Sister     Social History   Socioeconomic History  . Marital status: Married    Spouse name: Joycelyn Schmid  . Number of children: 1  . Years of education: 61  . Highest education level: Not on file  Occupational History  . Occupation: Manufacturing systems engineer  . Financial resource strain: Not on file  . Food insecurity:    Worry: Not on file    Inability: Not on file  . Transportation needs:    Medical: Not on file    Non-medical: Not on file  Tobacco Use  . Smoking status: Never Smoker  . Smokeless tobacco: Current User    Types: Chew  Substance and Sexual Activity  . Alcohol use: Yes    Comment: 6-10 beers per week  . Drug use: No  . Sexual activity: Yes    Birth control/protection: I.U.D.    Comment:  Mirena  Lifestyle  . Physical activity:    Days per week: Not on file    Minutes per session: Not on file  . Stress: Not on file  Relationships  . Social connections:    Talks on phone: Not on file    Gets together: Not on file    Attends religious service: Not on file    Active member of club or organization: Not on file    Attends meetings of clubs or organizations: Not on file    Relationship status: Not on file  . Intimate partner violence:    Fear of current or ex partner: Not on file    Emotionally abused: Not on file    Physically abused: Not on file    Forced sexual activity: Not on file  Other Topics Concern  . Not on file  Social History Narrative  . Not on file   Review of Systems  Constitutional: Negative for fever and weight loss.  HENT: Negative for ear discharge, ear pain, hearing loss and tinnitus.   Eyes: Negative for blurred vision, double vision, photophobia and pain.  Respiratory: Negative for cough and shortness  of breath.   Cardiovascular: Negative for chest pain and palpitations.  Gastrointestinal: Negative for abdominal pain, blood in stool, constipation, diarrhea, heartburn, melena, nausea and vomiting.  Genitourinary: Negative for dysuria, flank pain, frequency, hematuria and urgency.  Musculoskeletal: Negative for falls.  Neurological: Negative for dizziness, loss of consciousness and headaches.  Endo/Heme/Allergies: Negative for environmental allergies.  Psychiatric/Behavioral: Negative for depression, hallucinations, substance abuse and suicidal ideas. The patient is not nervous/anxious and does not have insomnia.    BP 130/82   Pulse 74   Temp 98 F (36.7 C) (Oral)   Resp 16   Ht 6' 2.75" (1.899 m)   Wt 256 lb (116.1 kg)   SpO2 98%   BMI 32.21 kg/m   Physical Exam Vitals signs reviewed.  Constitutional:      General: He is not in acute distress.    Appearance: He is well-developed. He is not diaphoretic.  HENT:     Head:  Normocephalic and atraumatic.     Right Ear: Tympanic membrane, ear canal and external ear normal.     Left Ear: Tympanic membrane, ear canal and external ear normal.     Nose: Nose normal.     Mouth/Throat:     Pharynx: No posterior oropharyngeal erythema.  Eyes:     Conjunctiva/sclera: Conjunctivae normal.     Pupils: Pupils are equal, round, and reactive to light.  Neck:     Musculoskeletal: Neck supple.     Thyroid: No thyromegaly.  Cardiovascular:     Rate and Rhythm: Normal rate and regular rhythm.     Heart sounds: Normal heart sounds.  Pulmonary:     Effort: Pulmonary effort is normal. No respiratory distress.     Breath sounds: Normal breath sounds. No wheezing or rales.  Chest:     Chest wall: No tenderness.  Abdominal:     General: Bowel sounds are normal. There is no distension.     Palpations: Abdomen is soft. There is no mass.     Tenderness: There is no abdominal tenderness. There is no guarding or rebound.  Lymphadenopathy:     Cervical: No cervical adenopathy.  Skin:    General: Skin is warm and dry.     Findings: No rash.  Neurological:     Mental Status: He is alert and oriented to person, place, and time.     Cranial Nerves: No cranial nerve deficit.    Assessment/Plan: 1. Visit for preventive health examination Depression screen negative. Health Maintenance reviewed. Preventive schedule discussed and handout given in AVS. Will obtain fasting labs today.  - CBC with Differential/Platelet - Comprehensive metabolic panel - Hemoglobin A1c - Lipid panel  2. Hyperlipidemia, unspecified hyperlipidemia type Repeat fasting lipids lab panel. - Comprehensive metabolic panel - Hemoglobin A1c - Lipid panel   Leeanne Rio, PA-C

## 2018-09-18 NOTE — Patient Instructions (Signed)
-Please go to the lab for blood work.  -Our office will call you with your results unless you have chosen to receive results via MyChart. -If your blood work is normal we will follow-up each year for physicals and as scheduled for chronic medical problems. -If anything is abnormal we will treat accordingly and get you in for a follow-up.   Preventive Care 18-39 Years, Male Preventive care refers to lifestyle choices and visits with your health care provider that can promote health and wellness. What does preventive care include?   A yearly physical exam. This is also called an annual well check.  Dental exams once or twice a year.  Routine eye exams. Ask your health care provider how often you should have your eyes checked.  Personal lifestyle choices, including: ? Daily care of your teeth and gums. ? Regular physical activity. ? Eating a healthy diet. ? Avoiding tobacco and drug use. ? Limiting alcohol use. ? Practicing safe sex. What happens during an annual well check? The services and screenings done by your health care provider during your annual well check will depend on your age, overall health, lifestyle risk factors, and family history of disease. Counseling Your health care provider may ask you questions about your:  Alcohol use.  Tobacco use.  Drug use.  Emotional well-being.  Home and relationship well-being.  Sexual activity.  Eating habits.  Work and work environment. Screening You may have the following tests or measurements:  Height, weight, and BMI.  Blood pressure.  Lipid and cholesterol levels. These may be checked every 5 years starting at age 20.  Diabetes screening. This is done by checking your blood sugar (glucose) after you have not eaten for a while (fasting).  Skin check.  Hepatitis C blood test.  Hepatitis B blood test.  Sexually transmitted disease (STD) testing. Discuss your test results, treatment options, and if necessary,  the need for more tests with your health care provider. Vaccines Your health care provider may recommend certain vaccines, such as:  Influenza vaccine. This is recommended every year.  Tetanus, diphtheria, and acellular pertussis (Tdap, Td) vaccine. You may need a Td booster every 10 years.  Varicella vaccine. You may need this if you have not been vaccinated.  HPV vaccine. If you are 26 or younger, you may need three doses over 6 months.  Measles, mumps, and rubella (MMR) vaccine. You may need at least one dose of MMR.You may also need a second dose.  Pneumococcal 13-valent conjugate (PCV13) vaccine. You may need this if you have certain conditions and have not been vaccinated.  Pneumococcal polysaccharide (PPSV23) vaccine. You may need one or two doses if you smoke cigarettes or if you have certain conditions.  Meningococcal vaccine. One dose is recommended if you are age 19-21 years and a first-year college student living in a residence hall, or if you have one of several medical conditions. You may also need additional booster doses.  Hepatitis A vaccine. You may need this if you have certain conditions or if you travel or work in places where you may be exposed to hepatitis A.  Hepatitis B vaccine. You may need this if you have certain conditions or if you travel or work in places where you may be exposed to hepatitis B.  Haemophilus influenzae type b (Hib) vaccine. You may need this if you have certain risk factors. Talk to your health care provider about which screenings and vaccines you need and how often you need them.   This information is not intended to replace advice given to you by your health care provider. Make sure you discuss any questions you have with your health care provider. Document Released: 08/27/2001 Document Revised: 02/11/2017 Document Reviewed: 05/02/2015 Elsevier Interactive Patient Education  [employer]. .

## 2019-05-17 ENCOUNTER — Telehealth: Payer: Self-pay | Admitting: Physician Assistant

## 2019-05-17 ENCOUNTER — Other Ambulatory Visit: Payer: Self-pay

## 2019-05-17 DIAGNOSIS — Z20822 Contact with and (suspected) exposure to covid-19: Secondary | ICD-10-CM

## 2019-05-17 NOTE — Telephone Encounter (Signed)
Ok for note 

## 2019-05-17 NOTE — Telephone Encounter (Signed)
Patient called saying that his son was exposed to a kid in his class at school. Patient and his family went and got tested today at [facility] but patient is wanting to know if he needed to stay out of work until he gets his results. If so, patient is wanting to know if there could be a note in his mychart so he can show his employer just incase they are asking for a note to be out of work. Please advise.

## 2019-05-17 NOTE — Telephone Encounter (Signed)
Patient will need to remain quarantine until his lab results are in. If negative and he is asymptomatic, may return to work at that time. OK to write work note for patient.

## 2019-05-18 LAB — NOVEL CORONAVIRUS, NAA: SARS-CoV-2, NAA: NOT DETECTED

## 2019-05-18 NOTE — Telephone Encounter (Signed)
Patient advised of staying quarantine until results are in. Work note placed in chart until results are in

## 2019-08-11 ENCOUNTER — Other Ambulatory Visit: Payer: Federal, State, Local not specified - PPO

## 2019-09-20 ENCOUNTER — Encounter: Payer: Self-pay | Admitting: Physician Assistant

## 2019-09-20 ENCOUNTER — Other Ambulatory Visit: Payer: Self-pay

## 2019-09-20 ENCOUNTER — Ambulatory Visit (INDEPENDENT_AMBULATORY_CARE_PROVIDER_SITE_OTHER): Payer: Federal, State, Local not specified - PPO | Admitting: Physician Assistant

## 2019-09-20 VITALS — BP 132/84 | HR 75 | Temp 98.3°F | Resp 16 | Ht 74.75 in | Wt 246.0 lb

## 2019-09-20 DIAGNOSIS — E785 Hyperlipidemia, unspecified: Secondary | ICD-10-CM

## 2019-09-20 DIAGNOSIS — Z0000 Encounter for general adult medical examination without abnormal findings: Secondary | ICD-10-CM | POA: Diagnosis not present

## 2019-09-20 LAB — COMPREHENSIVE METABOLIC PANEL
ALT: 21 U/L (ref 0–53)
AST: 15 U/L (ref 0–37)
Albumin: 4.4 g/dL (ref 3.5–5.2)
Alkaline Phosphatase: 56 U/L (ref 39–117)
BUN: 13 mg/dL (ref 6–23)
CO2: 27 mEq/L (ref 19–32)
Calcium: 9.7 mg/dL (ref 8.4–10.5)
Chloride: 104 mEq/L (ref 96–112)
Creatinine, Ser: 0.99 mg/dL (ref 0.40–1.50)
GFR: 83.63 mL/min (ref >60.00)
Glucose, Bld: 85 mg/dL (ref 70–99)
Potassium: 4.3 mEq/L (ref 3.5–5.1)
Sodium: 138 mEq/L (ref 135–145)
Total Bilirubin: 1.8 mg/dL — ABNORMAL HIGH (ref 0.2–1.2)
Total Protein: 7 g/dL (ref 6.0–8.3)

## 2019-09-20 LAB — LIPID PANEL
Cholesterol: 206 mg/dL — ABNORMAL HIGH (ref 0–200)
HDL: 39 mg/dL — ABNORMAL LOW (ref >39.00)
LDL Cholesterol: 133 mg/dL — ABNORMAL HIGH (ref 0–99)
NonHDL: 167.29
Total CHOL/HDL Ratio: 5
Triglycerides: 172 mg/dL — ABNORMAL HIGH (ref 0.0–149.0)
VLDL: 34.4 mg/dL (ref 0.0–40.0)

## 2019-09-20 LAB — CBC WITH DIFFERENTIAL/PLATELET
Basophils Absolute: 0 10*3/uL (ref 0.0–0.1)
Basophils Relative: 0.4 % (ref 0.0–3.0)
Eosinophils Absolute: 0.1 10*3/uL (ref 0.0–0.7)
Eosinophils Relative: 1.1 % (ref 0.0–5.0)
HCT: 46.9 % (ref 39.0–52.0)
Hemoglobin: 16 g/dL (ref 13.0–17.0)
Lymphocytes Relative: 31.4 % (ref 12.0–46.0)
Lymphs Abs: 2.4 10*3/uL (ref 0.7–4.0)
MCHC: 34.1 g/dL (ref 30.0–36.0)
MCV: 87.6 fl (ref 78.0–100.0)
Monocytes Absolute: 0.6 10*3/uL (ref 0.1–1.0)
Monocytes Relative: 8.5 % (ref 3.0–12.0)
Neutro Abs: 4.4 10*3/uL (ref 1.4–7.7)
Neutrophils Relative %: 58.6 % (ref 43.0–77.0)
Platelets: 253 10*3/uL (ref 150.0–400.0)
RBC: 5.36 Mil/uL (ref 4.22–5.81)
RDW: 12.9 % (ref 11.5–15.5)
WBC: 7.5 10*3/uL (ref 4.0–10.5)

## 2019-09-20 LAB — HEMOGLOBIN A1C: Hgb A1c MFr Bld: 5.1 % (ref 4.6–6.5)

## 2019-09-20 NOTE — Progress Notes (Signed)
Patient presents to clinic today for annual exam.  Patient is fasting for labs.  Diet -- Trying to keep a well-balanced diet. Making better choices than previously. Trying to limit salt intake. Fish 1-2 x week.   Exercise -- Did have a gym membership but due to [place] has not been using. Has bought some home equipment  Works 3rd shift so occasionally has issue with sleep. Has started Melatonin in the past couple of weeks and notes this is helping overall.  Acute Concerns: Denies acute concerns today.  Health Maintenance: Immunizations -- Flu and Tetanus up-to-date  Past Medical History:  Diagnosis Date  . History of chickenpox   . Kidney stones     Past Surgical History:  Procedure Laterality Date  . NO PAST SURGERIES      Current Outpatient Medications on File Prior to Visit  Medication Sig Dispense Refill  . EPINEPHrine 0.3 mg/0.3 mL IJ SOAJ injection INJ 1 ML IM PRN UTD FOR BEE ALLERGY  0   No current facility-administered medications on file prior to visit.    No Known Allergies  Family History  Problem Relation Age of Onset  . Cancer Mother        Leiomyosarcoma  . Diabetes Father   . Other Father        Brain Tumor  . Multiple sclerosis Sister   . Healthy Son        1 years old  . Cancer Maternal Uncle        Lung  . Cancer Paternal Grandmother        Breast  . Heart disease Paternal Grandfather   . Healthy Sister   . Healthy Sister     Social History   Socioeconomic History  . Marital status: Married    Spouse name: Joycelyn Schmid  . Number of children: 1  . Years of education: 65  . Highest education level: Not on file  Occupational History  . Occupation: Dealer  Tobacco Use  . Smoking status: Never Smoker  . Smokeless tobacco: Current User    Types: Chew  Substance and Sexual Activity  . Alcohol use: Yes    Comment: 6-10 beers per week  . Drug use: No  . Sexual activity: Yes  Other Topics Concern  . Not on file  Social History  Narrative  . Not on file   Social Determinants of Health   Financial Resource Strain:   . Difficulty of Paying Living Expenses: Not on file  Food Insecurity:   . Worried About Charity fundraiser in the Last Year: Not on file  . Ran Out of Food in the Last Year: Not on file  Transportation Needs:   . Lack of Transportation (Medical): Not on file  . Lack of Transportation (Non-Medical): Not on file  Physical Activity:   . Days of Exercise per Week: Not on file  . Minutes of Exercise per Session: Not on file  Stress:   . Feeling of Stress : Not on file  Social Connections:   . Frequency of Communication with Friends and Family: Not on file  . Frequency of Social Gatherings with Friends and Family: Not on file  . Attends Religious Services: Not on file  . Active Member of Clubs or Organizations: Not on file  . Attends Archivist Meetings: Not on file  . Marital Status: Not on file  Intimate Partner Violence:   . Fear of Current or Ex-Partner: Not on file  .  Emotionally Abused: Not on file  . Physically Abused: Not on file  . Sexually Abused: Not on file   Review of Systems  Constitutional: Negative for fever and weight loss.  HENT: Negative for ear discharge, ear pain, hearing loss and tinnitus.   Eyes: Negative for blurred vision, double vision, photophobia and pain.  Respiratory: Negative for cough and shortness of breath.   Cardiovascular: Negative for chest pain and palpitations.  Gastrointestinal: Negative for abdominal pain, blood in stool, constipation, diarrhea, heartburn, melena, nausea and vomiting.  Genitourinary: Negative for dysuria, flank pain, frequency, hematuria and urgency.  Musculoskeletal: Negative for falls.  Neurological: Negative for dizziness, loss of consciousness and headaches.  Endo/Heme/Allergies: Negative for environmental allergies.  Psychiatric/Behavioral: Negative for depression, hallucinations, substance abuse and suicidal ideas. The  patient is not nervous/anxious and does not have insomnia.    BP 132/84   Pulse 75   Temp 98.3 F (36.8 C) (Temporal)   Resp 16   Ht 6' 2.75" (1.899 m)   Wt 246 lb (111.6 kg)   SpO2 98%   BMI 30.95 kg/m   Physical Exam Vitals reviewed.  Constitutional:      General: He is not in acute distress.    Appearance: He is well-developed. He is not diaphoretic.  HENT:     Head: Normocephalic and atraumatic.     Right Ear: Tympanic membrane, ear canal and external ear normal.     Left Ear: Tympanic membrane, ear canal and external ear normal.     Nose: Nose normal.     Mouth/Throat:     Pharynx: No posterior oropharyngeal erythema.  Eyes:     Conjunctiva/sclera: Conjunctivae normal.     Pupils: Pupils are equal, round, and reactive to light.  Neck:     Thyroid: No thyromegaly.  Cardiovascular:     Rate and Rhythm: Normal rate and regular rhythm.     Heart sounds: Normal heart sounds.  Pulmonary:     Effort: Pulmonary effort is normal. No respiratory distress.     Breath sounds: Normal breath sounds. No wheezing or rales.  Chest:     Chest wall: No tenderness.  Abdominal:     General: Bowel sounds are normal. There is no distension.     Palpations: Abdomen is soft. There is no mass.     Tenderness: There is no abdominal tenderness. There is no guarding or rebound.  Musculoskeletal:     Cervical back: Neck supple.  Lymphadenopathy:     Cervical: No cervical adenopathy.  Skin:    General: Skin is warm and dry.     Findings: No rash.  Neurological:     Mental Status: He is alert and oriented to person, place, and time.     Cranial Nerves: No cranial nerve deficit.    Assessment/Plan: No problem-specific Assessment & Plan notes found for this encounter.  This visit occurred during the SARS-CoV-2 public health emergency.  Safety protocols were in place, including screening questions prior to the visit, additional usage of staff PPE, and extensive cleaning of exam room while  observing appropriate contact time as indicated for disinfecting solutions.     Leeanne Rio, PA-C

## 2019-09-20 NOTE — Patient Instructions (Signed)
Please go to the lab for blood work.   Our office will call you with your results unless you have chosen to receive results via MyChart.  If your blood work is normal we will follow-up each year for physicals and as scheduled for chronic medical problems.  If anything is abnormal we will treat accordingly and get you in for a follow-up.   Preventive Care 41-41 Years Old, Male Preventive care refers to lifestyle choices and visits with your health care provider that can promote health and wellness. This includes:  A yearly physical exam. This is also called an annual well check.  Regular dental and eye exams.  Immunizations.  Screening for certain conditions.  Healthy lifestyle choices, such as eating a healthy diet, getting regular exercise, not using drugs or products that contain nicotine and tobacco, and limiting alcohol use. What can I expect for my preventive care visit? Physical exam Your health care provider will check:  Height and weight. These may be used to calculate body mass index (BMI), which is a measurement that tells if you are at a healthy weight.  Heart rate and blood pressure.  Your skin for abnormal spots. Counseling Your health care provider may ask you questions about:  Alcohol, tobacco, and drug use.  Emotional well-being.  Home and relationship well-being.  Sexual activity.  Eating habits.  Work and work environment. What immunizations do I need?  Influenza (flu) vaccine  This is recommended every year. Tetanus, diphtheria, and pertussis (Tdap) vaccine  You may need a Td booster every 10 years. Varicella (chickenpox) vaccine  You may need this vaccine if you have not already been vaccinated. Zoster (shingles) vaccine  You may need this after age 60. Measles, mumps, and rubella (MMR) vaccine  You may need at least one dose of MMR if you were born in 1957 or later. You may also need a second dose. Pneumococcal conjugate (PCV13)  vaccine  You may need this if you have certain conditions and were not previously vaccinated. Pneumococcal polysaccharide (PPSV23) vaccine  You may need one or two doses if you smoke cigarettes or if you have certain conditions. Meningococcal conjugate (MenACWY) vaccine  You may need this if you have certain conditions. Hepatitis A vaccine  You may need this if you have certain conditions or if you travel or work in places where you may be exposed to hepatitis A. Hepatitis B vaccine  You may need this if you have certain conditions or if you travel or work in places where you may be exposed to hepatitis B. Haemophilus influenzae type b (Hib) vaccine  You may need this if you have certain risk factors. Human papillomavirus (HPV) vaccine  If recommended by your health care provider, you may need three doses over 6 months. You may receive vaccines as individual doses or as more than one vaccine together in one shot (combination vaccines). Talk with your health care provider about the risks and benefits of combination vaccines. What tests do I need? Blood tests  Lipid and cholesterol levels. These may be checked every 5 years, or more frequently if you are over 50 years old.  Hepatitis C test.  Hepatitis B test. Screening  Lung cancer screening. You may have this screening every year starting at age 55 if you have a 30-pack-year history of smoking and currently smoke or have quit within the past 15 years.  Prostate cancer screening. Recommendations will vary depending on your family history and other risks.  Colorectal cancer   screening. All adults should have this screening starting at age 50 and continuing until age 75. Your health care provider may recommend screening at age 45 if you are at increased risk. You will have tests every 1-10 years, depending on your results and the type of screening test.  Diabetes screening. This is done by checking your blood sugar (glucose) after  you have not eaten for a while (fasting). You may have this done every 1-3 years.  Sexually transmitted disease (STD) testing. Follow these instructions at home: Eating and drinking  Eat a diet that includes fresh fruits and vegetables, whole grains, lean protein, and low-fat dairy products.  Take vitamin and mineral supplements as recommended by your health care provider.  Do not drink alcohol if your health care provider tells you not to drink.  If you drink alcohol: ? Limit how much you have to 0-2 drinks a day. ? Be aware of how much alcohol is in your drink. In the [place] one drink equals one 12 oz bottle of beer (355 mL), one 5 oz glass of wine (148 mL), or one 1 oz glass of hard liquor (44 mL). Lifestyle  Take daily care of your teeth and gums.  Stay active. Exercise for at least 30 minutes on 5 or more days each week.  Do not use any products that contain nicotine or tobacco, such as cigarettes, e-cigarettes, and chewing tobacco. If you need help quitting, ask your health care provider.  If you are sexually active, practice safe sex. Use a condom or other form of protection to prevent STIs (sexually transmitted infections).  Talk with your health care provider about taking a low-dose aspirin every day starting at age 50. What's next?  Go to your health care provider once a year for a well check visit.  Ask your health care provider how often you should have your eyes and teeth checked.  Stay up to date on all vaccines. This information is not intended to replace advice given to you by your health care provider. Make sure you discuss any questions you have with your health care provider. Document Revised: 06/25/2018 Document Reviewed: 06/25/2018 Elsevier Patient Education  2020 [employer] Inc[employer] .      

## 2019-10-17 DIAGNOSIS — Z20828 Contact with and (suspected) exposure to other viral communicable diseases: Secondary | ICD-10-CM | POA: Diagnosis not present

## 2019-10-17 DIAGNOSIS — Z20822 Contact with and (suspected) exposure to covid-19: Secondary | ICD-10-CM | POA: Diagnosis not present

## 2019-10-18 ENCOUNTER — Telehealth: Payer: Self-pay

## 2019-10-18 NOTE — Telephone Encounter (Signed)
As noted before -- ok to write the note for him -- it will automatically upload to his MyChart.

## 2019-10-18 NOTE — Telephone Encounter (Signed)
Great news.

## 2019-10-18 NOTE — Telephone Encounter (Signed)
Patient states he had an exposure to covid on 10/10/19. He received his 2nd shot on 10/12/19. For about 12hrs he states he had a fever and chills. He states his brother in law tested positive on 10/17/19. He went to [facility] on 10/17/19 and got tested but has not received his results. Patient would like to know if he needs to quarantine, and if so, he states he will need a note for his employer. Please advise.

## 2019-10-18 NOTE — Telephone Encounter (Signed)
Spoke with patient regarding PCP recommendations. Patient voiced understanding. Patient would like for you to upload letter to his mychart.

## 2019-10-18 NOTE — Telephone Encounter (Signed)
Since he already had 1st shot that should offer him some protection. The short-lived symptoms he had would most likely have been a response to the 2nd vaccine he received. I would recommend quarantining until results are in to be on the safe side. Ok to write note for patient.

## 2019-10-18 NOTE — Telephone Encounter (Signed)
Called patient to inform him that his work note was sent to his mychart account. Patient informed me his results have come back and he is negative for covid.

## 2020-02-24 ENCOUNTER — Telehealth: Payer: Self-pay | Admitting: Physician Assistant

## 2020-02-24 ENCOUNTER — Other Ambulatory Visit: Payer: Self-pay

## 2020-02-24 ENCOUNTER — Telehealth (INDEPENDENT_AMBULATORY_CARE_PROVIDER_SITE_OTHER): Payer: Federal, State, Local not specified - PPO | Admitting: Physician Assistant

## 2020-02-24 ENCOUNTER — Encounter: Payer: Self-pay | Admitting: Physician Assistant

## 2020-02-24 VITALS — Ht 74.0 in | Wt 245.0 lb

## 2020-02-24 DIAGNOSIS — S76302A Unspecified injury of muscle, fascia and tendon of the posterior muscle group at thigh level, left thigh, initial encounter: Secondary | ICD-10-CM | POA: Diagnosis not present

## 2020-02-24 NOTE — Telephone Encounter (Signed)
Patient called stating that his prescription was not called in after his my chart visit this morning.  Please advise.

## 2020-02-24 NOTE — Progress Notes (Signed)
   Virtual Visit via Video   I connected with patient on 02/24/20 at  9:00 AM EDT by a video enabled telemedicine application and verified that I am speaking with the correct person using two identifiers.  Location patient: Home Location provider: [facility] Persons participating in the virtual visit: Patient, Provider, [place] (Patina Moore)  I discussed the limitations of evaluation and management by telemedicine and the availability of in person appointments. The patient expressed understanding and agreed to proceed.  Subjective:   HPI:   Patient presents via [place] today c/o injury to left hamstring occurring last week.  Patient endorses he [place], running then felt pop in his L hamstring. This happened last Tuesday/Wednesday. Could not walk without pain for a few days.  Notes his symptoms were significantly improving over the weekend but went back to work Saturday night -- has to do a lot of lifting and squatting which was painful. Noted mild swelling and bruising Sunday morning.  Denies any foot or ankle pain.  ROS:   See pertinent positives and negatives per HPI.  Patient Active Problem List   Diagnosis Date Noted  . Skin lesion of back 09/15/2017  . Skin tag 09/15/2017  . Hyperlipidemia 05/28/2017  . Need for immunization against influenza 05/28/2017  . 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 Chewing tobacco use 01/08/2017  . Visit for preventive health examination 11/27/2016    Social History   Tobacco Use  . Smoking status: Never Smoker  . Smokeless tobacco: Current User    Types: Chew  Substance Use Topics  . Alcohol use: Yes    Comment: 6-10 beers per week    Current Outpatient Medications:  .  EPINEPHrine 0.3 mg/0.3 mL IJ SOAJ injection, INJ 1 ML IM PRN UTD FOR BEE ALLERGY (Patient not taking: Reported on 02/24/2020), Disp: , Rfl: 0  No Known Allergies  Objective:   Ht 6\' 2"  (1.88 m)   Wt 245 lb (111.1 kg)   BMI 31.46  kg/m   Patient is well-developed, well-nourished in no acute distress.  Resting comfortably at home.  Head is normocephalic, atraumatic.  No labored breathing.  Speech is clear and coherent with logical content.  Patient is alert and oriented at baseline.  Bruising of medial left popliteal region noted on video examination.  No evidence of erythema or abnormal muscle placement.  Assessment and Plan:   1. Left hamstring injury, initial encounter Suspect significant strain versus partial tear.  Recommend alternation of ice and heat.  Elevate while resting.  Compression recommended.  Rx meloxicam once daily.  Tylenol for breakthrough pain.  Patient has been written out of work for the remainder of the week.  If not significantly improving over the rest of this week/weekend he is to follow-up in office Monday morning for in person examination.    Leeanne Rio, PA-C 02/24/2020

## 2020-02-24 NOTE — Patient Instructions (Signed)
Instructions sent to MyChart

## 2020-02-24 NOTE — Progress Notes (Signed)
I have discussed the procedure for the virtual visit with the patient who has given consent to proceed with assessment and treatment.   Pt unable to obtain vitals.   Jb Dulworth L Amed Datta, CMA     

## 2020-02-25 MED ORDER — MELOXICAM 15 MG PO TABS: 15.0000 mg | ORAL_TABLET | Freq: Every day | ORAL | 0 refills | Status: AC

## 2020-02-25 NOTE — Telephone Encounter (Signed)
Rx sent to pharmacy   

## 2020-02-28 ENCOUNTER — Telehealth: Payer: Self-pay | Admitting: Physician Assistant

## 2020-02-28 NOTE — Telephone Encounter (Signed)
Patient was seen on 02/24/20 for left hamstring pain. Per your notes if not improving will need to be seen in office for evaluation. He has been written out of work. Please advise

## 2020-02-28 NOTE — Telephone Encounter (Signed)
If he is to return to work I would have him return with restrictions -- no bending, squatting or lifting > 15 pounds for the next week. Ok to write letter.

## 2020-02-28 NOTE — Telephone Encounter (Signed)
Pt called in that his hamstring is somewhat better but it is still tinder when he squats down. He said he thinks he can go back to work but maybe not on full duty. He wanted to see what Cody's thoughts were. Pt can be reached at  [phone]

## 2020-02-29 NOTE — Telephone Encounter (Signed)
Spoke with patient and advised that we will do a note for work with restrictions. Patient is agreeable and able to print letter from my chart. Letter completed with restrictions

## 2020-09-25 ENCOUNTER — Encounter: Payer: Federal, State, Local not specified - PPO | Admitting: Physician Assistant

## 2021-05-21 DIAGNOSIS — Z0000 Encounter for general adult medical examination without abnormal findings: Secondary | ICD-10-CM | POA: Diagnosis not present

## 2021-05-21 DIAGNOSIS — Z23 Encounter for immunization: Secondary | ICD-10-CM | POA: Diagnosis not present

## 2021-05-21 DIAGNOSIS — Z130 Encounter for screening for diseases of the blood and blood-forming organs and certain disorders involving the immune mechanism: Secondary | ICD-10-CM | POA: Diagnosis not present

## 2021-05-21 DIAGNOSIS — Z13220 Encounter for screening for lipoid disorders: Secondary | ICD-10-CM | POA: Diagnosis not present

## 2021-05-21 DIAGNOSIS — Z125 Encounter for screening for malignant neoplasm of prostate: Secondary | ICD-10-CM | POA: Diagnosis not present

## 2021-06-01 DIAGNOSIS — J40 Bronchitis, not specified as acute or chronic: Secondary | ICD-10-CM | POA: Diagnosis not present

## 2021-06-01 DIAGNOSIS — R5383 Other fatigue: Secondary | ICD-10-CM | POA: Diagnosis not present

## 2021-06-01 DIAGNOSIS — R059 Cough, unspecified: Secondary | ICD-10-CM | POA: Diagnosis not present

## 2021-06-01 DIAGNOSIS — Z125 Encounter for screening for malignant neoplasm of prostate: Secondary | ICD-10-CM | POA: Diagnosis not present

## 2021-06-01 DIAGNOSIS — Z13220 Encounter for screening for lipoid disorders: Secondary | ICD-10-CM | POA: Diagnosis not present

## 2021-06-01 DIAGNOSIS — Z130 Encounter for screening for diseases of the blood and blood-forming organs and certain disorders involving the immune mechanism: Secondary | ICD-10-CM | POA: Diagnosis not present

## 2021-06-05 DIAGNOSIS — S76302A Unspecified injury of muscle, fascia and tendon of the posterior muscle group at thigh level, left thigh, initial encounter: Secondary | ICD-10-CM | POA: Diagnosis not present

## 2021-06-20 DIAGNOSIS — S76302A Unspecified injury of muscle, fascia and tendon of the posterior muscle group at thigh level, left thigh, initial encounter: Secondary | ICD-10-CM | POA: Diagnosis not present

## 2021-06-22 DIAGNOSIS — S76302A Unspecified injury of muscle, fascia and tendon of the posterior muscle group at thigh level, left thigh, initial encounter: Secondary | ICD-10-CM | POA: Diagnosis not present

## 2021-06-27 DIAGNOSIS — S76302A Unspecified injury of muscle, fascia and tendon of the posterior muscle group at thigh level, left thigh, initial encounter: Secondary | ICD-10-CM | POA: Diagnosis not present

## 2021-06-29 DIAGNOSIS — S76302A Unspecified injury of muscle, fascia and tendon of the posterior muscle group at thigh level, left thigh, initial encounter: Secondary | ICD-10-CM | POA: Diagnosis not present

## 2021-07-02 DIAGNOSIS — S76302A Unspecified injury of muscle, fascia and tendon of the posterior muscle group at thigh level, left thigh, initial encounter: Secondary | ICD-10-CM | POA: Diagnosis not present

## 2021-07-11 DIAGNOSIS — S76302A Unspecified injury of muscle, fascia and tendon of the posterior muscle group at thigh level, left thigh, initial encounter: Secondary | ICD-10-CM | POA: Diagnosis not present
